# Patient Record
Sex: Female | Born: 1980 | Race: Black or African American | Hispanic: No | Marital: Married | Smoking: Never smoker
Health system: Southern US, Community
[De-identification: ages and names within clinical notes are randomized; demographics above are authoritative.]

## PROBLEM LIST (undated history)

## (undated) ENCOUNTER — Inpatient Hospital Stay (HOSPITAL_COMMUNITY): Payer: Self-pay

## (undated) DIAGNOSIS — I10 Essential (primary) hypertension: Secondary | ICD-10-CM

## (undated) DIAGNOSIS — E119 Type 2 diabetes mellitus without complications: Secondary | ICD-10-CM

## (undated) DIAGNOSIS — F329 Major depressive disorder, single episode, unspecified: Secondary | ICD-10-CM

## (undated) DIAGNOSIS — F32A Depression, unspecified: Secondary | ICD-10-CM

## (undated) HISTORY — PX: OTHER SURGICAL HISTORY: SHX169

## (undated) HISTORY — PX: HERNIA REPAIR: SHX51

## (undated) HISTORY — PX: BREAST SURGERY: SHX581

---
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
## 2003-12-14 ENCOUNTER — Emergency Department (HOSPITAL_COMMUNITY): Admission: EM | Admit: 2003-12-14 | Discharge: 2003-12-15 | Payer: Self-pay | Admitting: *Deleted

## 2004-03-17 ENCOUNTER — Ambulatory Visit (HOSPITAL_COMMUNITY): Admission: RE | Admit: 2004-03-17 | Discharge: 2004-03-17 | Payer: Self-pay | Admitting: Family Medicine

## 2004-03-24 ENCOUNTER — Encounter (HOSPITAL_COMMUNITY): Admission: RE | Admit: 2004-03-24 | Discharge: 2004-04-23 | Payer: Self-pay | Admitting: Family Medicine

## 2004-03-28 ENCOUNTER — Emergency Department (HOSPITAL_COMMUNITY): Admission: EM | Admit: 2004-03-28 | Discharge: 2004-03-28 | Payer: Self-pay | Admitting: Emergency Medicine

## 2004-11-12 ENCOUNTER — Other Ambulatory Visit: Admission: RE | Admit: 2004-11-12 | Discharge: 2004-11-12 | Payer: Self-pay | Admitting: Family Medicine

## 2005-11-23 ENCOUNTER — Other Ambulatory Visit: Admission: RE | Admit: 2005-11-23 | Discharge: 2005-11-23 | Payer: Self-pay | Admitting: *Deleted

## 2006-12-07 ENCOUNTER — Other Ambulatory Visit: Admission: RE | Admit: 2006-12-07 | Discharge: 2006-12-07 | Payer: Self-pay | Admitting: *Deleted

## 2008-04-17 ENCOUNTER — Encounter: Admission: RE | Admit: 2008-04-17 | Discharge: 2008-04-17 | Payer: Self-pay | Admitting: Obstetrics and Gynecology

## 2008-05-26 ENCOUNTER — Inpatient Hospital Stay (HOSPITAL_COMMUNITY): Admission: AD | Admit: 2008-05-26 | Discharge: 2008-05-26 | Payer: Self-pay | Admitting: Obstetrics and Gynecology

## 2008-06-18 ENCOUNTER — Inpatient Hospital Stay (HOSPITAL_COMMUNITY): Admission: RE | Admit: 2008-06-18 | Discharge: 2008-06-21 | Payer: Self-pay | Admitting: Obstetrics and Gynecology

## 2008-06-24 ENCOUNTER — Inpatient Hospital Stay (HOSPITAL_COMMUNITY): Admission: AD | Admit: 2008-06-24 | Discharge: 2008-06-24 | Payer: Self-pay | Admitting: Obstetrics and Gynecology

## 2008-11-22 ENCOUNTER — Ambulatory Visit (HOSPITAL_COMMUNITY): Admission: RE | Admit: 2008-11-22 | Discharge: 2008-11-22 | Payer: Self-pay | Admitting: Surgery

## 2008-12-05 ENCOUNTER — Ambulatory Visit (HOSPITAL_COMMUNITY): Admission: RE | Admit: 2008-12-05 | Discharge: 2008-12-07 | Payer: Self-pay | Admitting: Surgery

## 2008-12-09 ENCOUNTER — Inpatient Hospital Stay (HOSPITAL_COMMUNITY): Admission: EM | Admit: 2008-12-09 | Discharge: 2008-12-16 | Payer: Self-pay | Admitting: Emergency Medicine

## 2010-08-09 IMAGING — US US RENAL
1 series · 14 of 25 positions shown · non-contrast
Comparison: 12/15/2003

CLINICAL DATA: 27-year-old female 35-week pregnant,  back pain,

RENAL/URINARY TRACT ULTRASOUND
TECHNIQUE: Complete ultrasound examination of the urinary tract
was performed including evaluation of the kidneys, renal collecting
systems, and urinary bladder.

[Series 1: us renal · 14 of 35 slices shown]
[im 1/35]
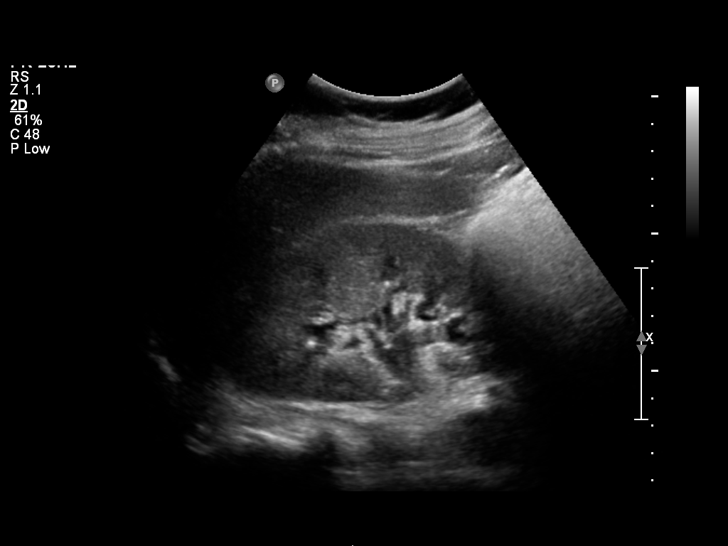
[im 3/35]
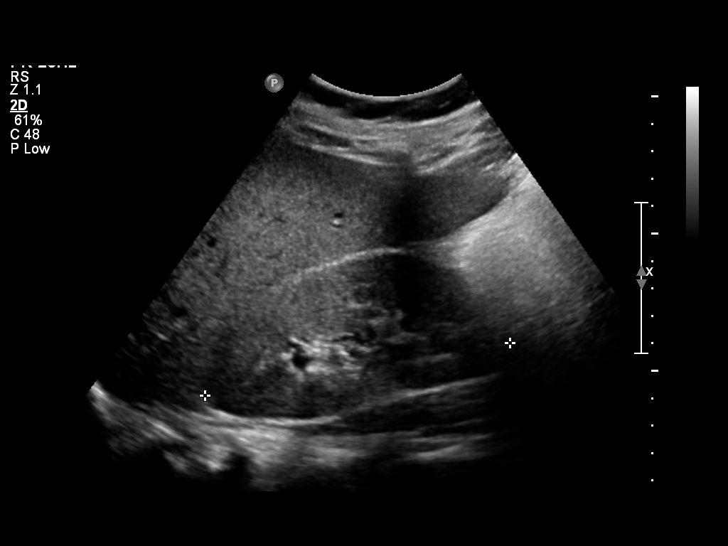
[im 6/35]
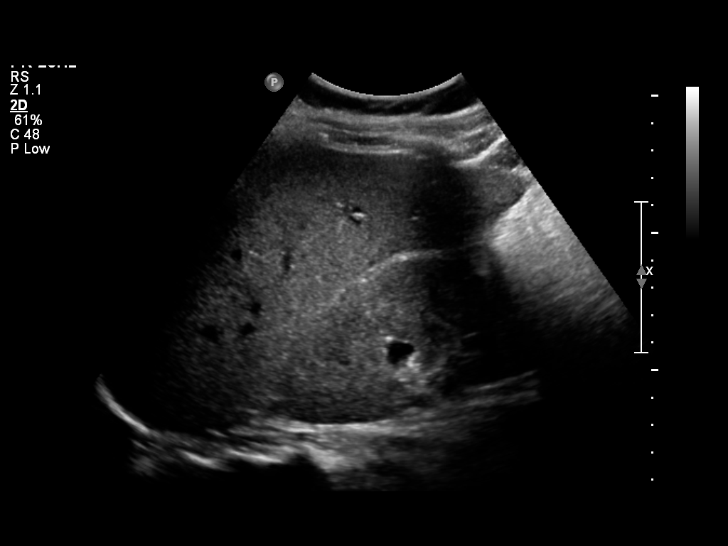
[im 9/35]
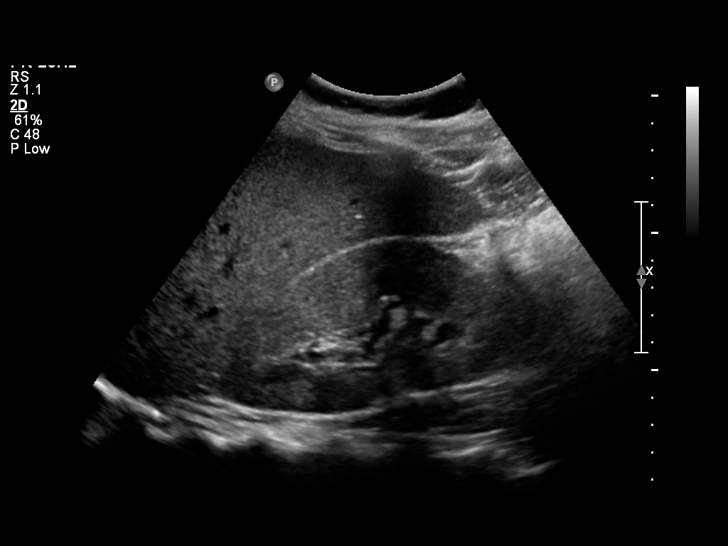
[im 12/35]
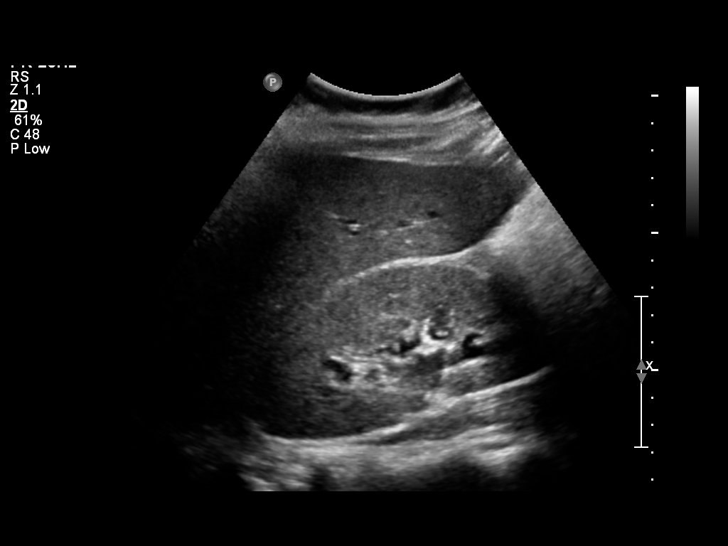
[im 13/35]
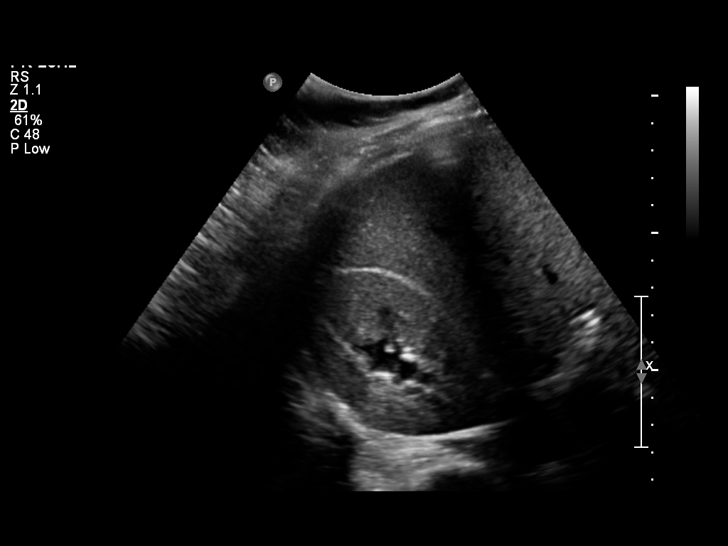
[im 16/35]
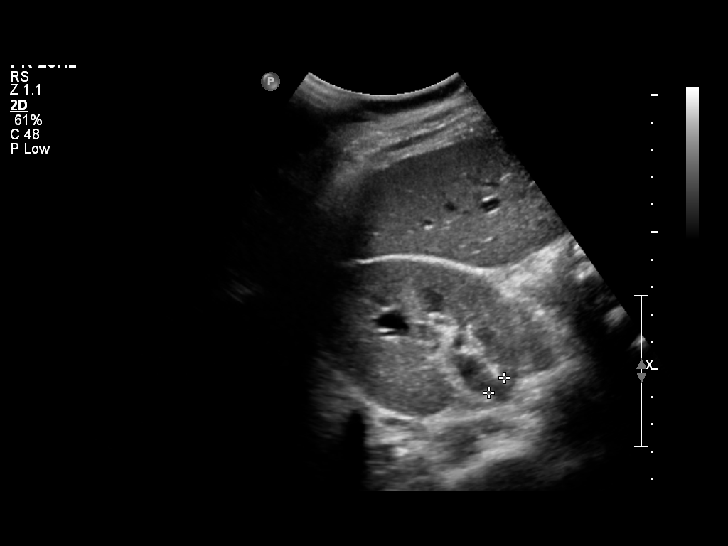
[im 19/35]
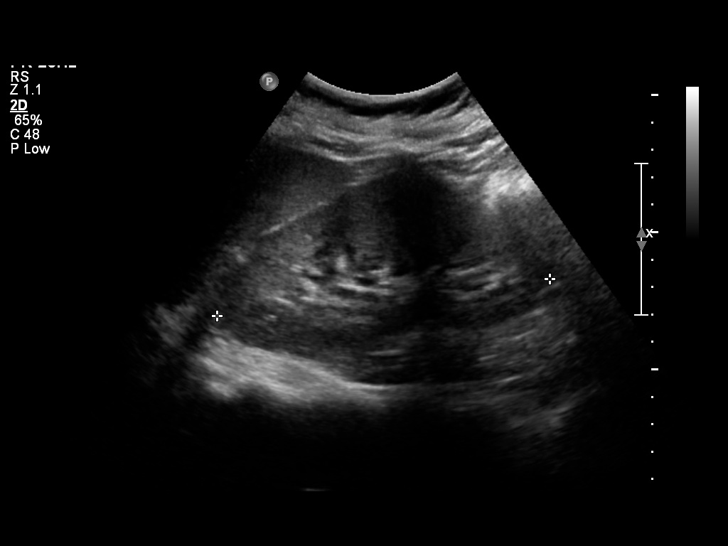
[im 22/35]
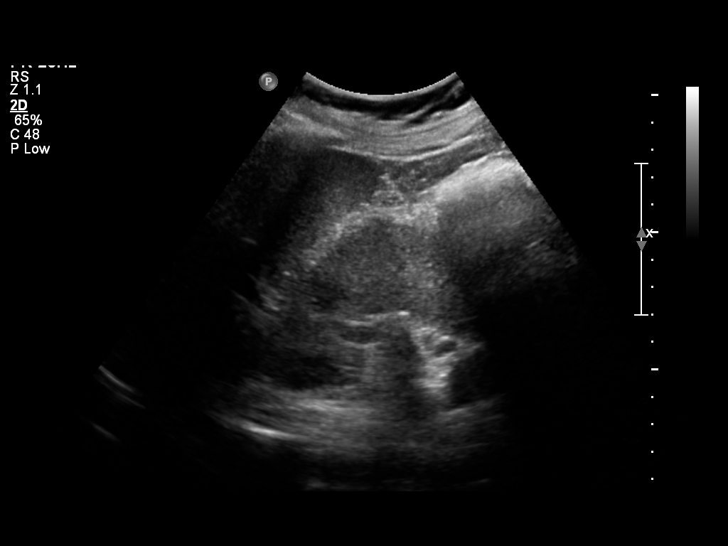
[im 23/35]
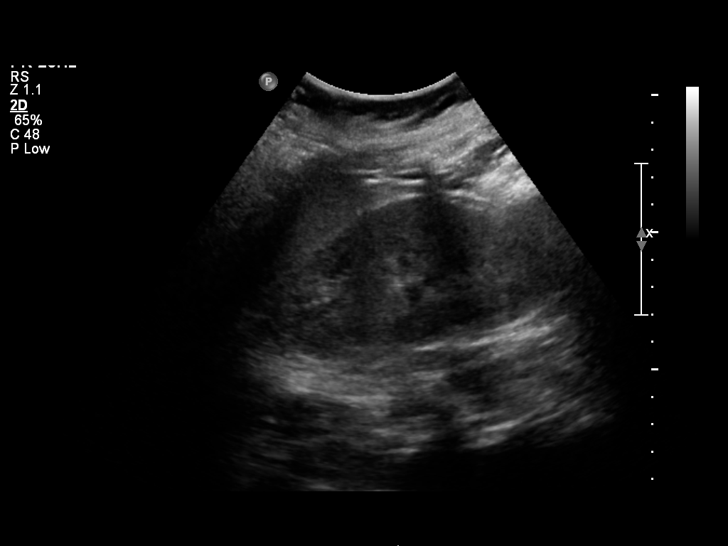
[im 26/35]
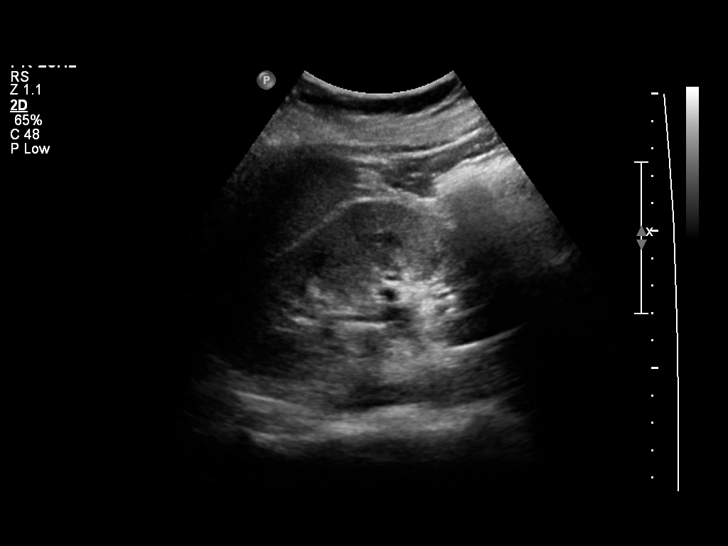
[im 29/35]
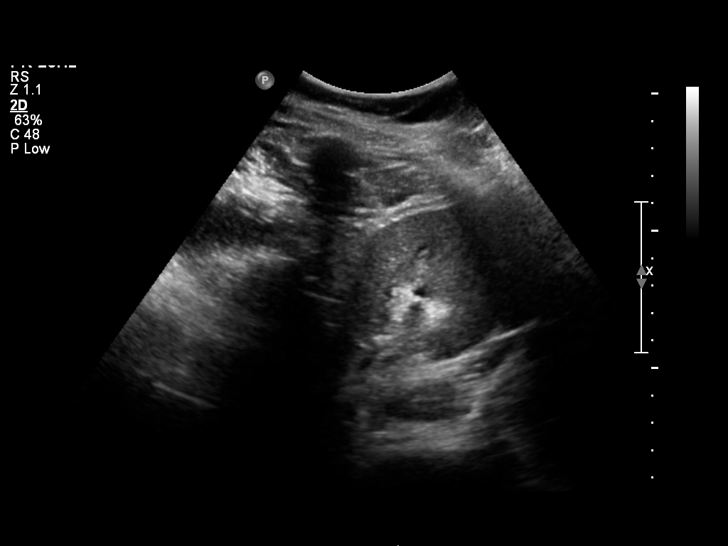
[im 32/35]
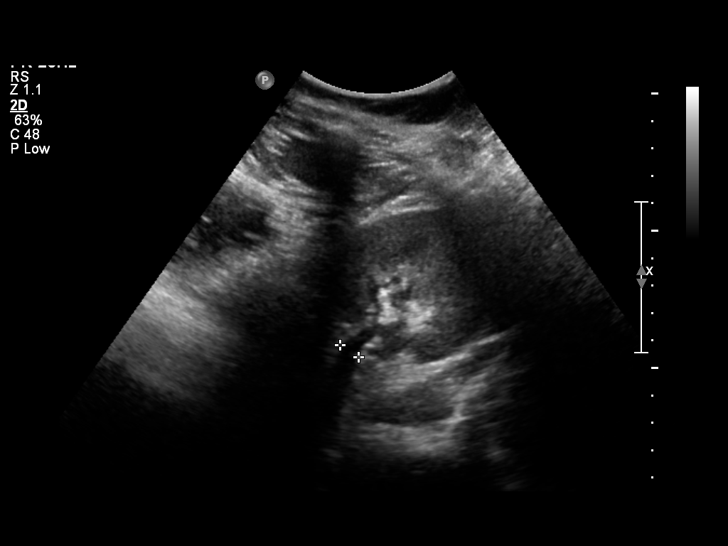
[im 35/35]
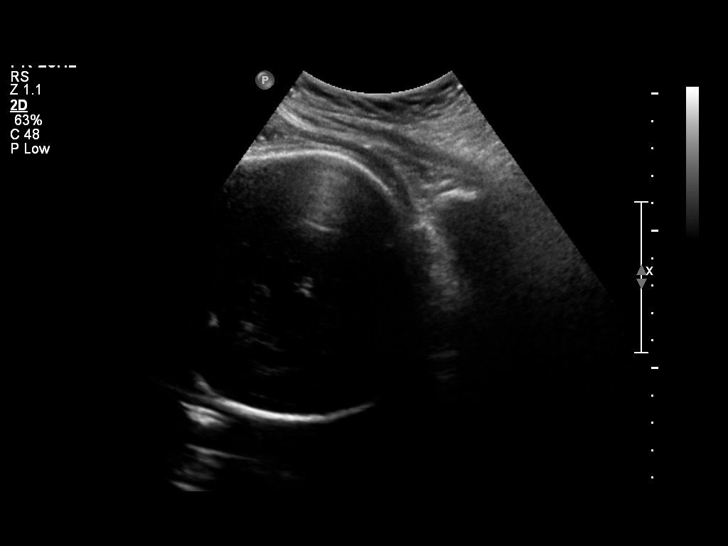

[14 of 25 positions shown; findings below may reference images not displayed]

FINDINGS: Right kidney measures 11.5 cm and demonstrates mild
distention of the renal pelvis and collecting system consistent
with pelviectasis versus mild hydronephrosis.  This could be
physiologic hydronephrosis related to third trimester pregnancy.
Left kidney measures 12.2 cm and demonstrates no evidence of
obstruction or dilatation.  No perinephric fluid collection.  No
definite echogenic renal calculus identified.  The bladder is
decompressed.  No free fluid.
IMPRESSION: Mild right pelviectasis versus hydronephrosis.

## 2010-09-06 LAB — CBC
HCT: 39.1 % (ref 36.0–46.0)
HCT: 39.4 % (ref 36.0–46.0)
HCT: 39.8 % (ref 36.0–46.0)
HCT: 40.5 % (ref 36.0–46.0)
HCT: 33.2 % — ABNORMAL LOW (ref 36.0–46.0)
HCT: 42.8 % (ref 36.0–46.0)
Hemoglobin: 13.1 g/dL (ref 12.0–15.0)
Hemoglobin: 13.3 g/dL (ref 12.0–15.0)
Hemoglobin: 13.6 g/dL (ref 12.0–15.0)
Hemoglobin: 13.7 g/dL (ref 12.0–15.0)
Hemoglobin: 12.3 g/dL (ref 12.0–15.0)
Hemoglobin: 14.3 g/dL (ref 12.0–15.0)
MCHC: 33.7 g/dL (ref 30.0–36.0)
MCHC: 33.8 g/dL (ref 30.0–36.0)
MCHC: 33.8 g/dL (ref 30.0–36.0)
MCHC: 34.1 g/dL (ref 30.0–36.0)
MCHC: 33 g/dL (ref 30.0–36.0)
MCHC: 33.5 g/dL (ref 30.0–36.0)
MCV: 89.5 fL (ref 78.0–100.0)
MCV: 89.6 fL (ref 78.0–100.0)
MCV: 89.7 fL (ref 78.0–100.0)
MCV: 90.7 fL (ref 78.0–100.0)
MCV: 90.6 fL (ref 78.0–100.0)
MCV: 90.9 fL (ref 78.0–100.0)
Platelets: 330 10*3/uL (ref 150–400)
Platelets: 340 10*3/uL (ref 150–400)
Platelets: 368 10*3/uL (ref 150–400)
Platelets: 372 10*3/uL (ref 150–400)
Platelets: 292 10*3/uL (ref 150–400)
Platelets: 355 10*3/uL (ref 150–400)
RBC: 4.31 MIL/uL (ref 3.87–5.11)
RBC: 4.4 MIL/uL (ref 3.87–5.11)
RBC: 4.44 MIL/uL (ref 3.87–5.11)
RBC: 4.52 MIL/uL (ref 3.87–5.11)
RBC: 4.07 MIL/uL (ref 3.87–5.11)
RBC: 4.72 MIL/uL (ref 3.87–5.11)
RDW: 13.5 % (ref 11.5–15.5)
RDW: 13.5 % (ref 11.5–15.5)
RDW: 13.5 % (ref 11.5–15.5)
RDW: 13.7 % (ref 11.5–15.5)
RDW: 13.2 % (ref 11.5–15.5)
RDW: 13.5 % (ref 11.5–15.5)
WBC: 20.1 10*3/uL — ABNORMAL HIGH (ref 4.0–10.5)
WBC: 21.4 10*3/uL — ABNORMAL HIGH (ref 4.0–10.5)
WBC: 21.8 10*3/uL — ABNORMAL HIGH (ref 4.0–10.5)
WBC: 22.2 10*3/uL — ABNORMAL HIGH (ref 4.0–10.5)
WBC: 13.6 10*3/uL — ABNORMAL HIGH (ref 4.0–10.5)
WBC: 9.6 10*3/uL (ref 4.0–10.5)

## 2010-09-06 LAB — DIFFERENTIAL
Basophils Absolute: 0.1 10*3/uL (ref 0.0–0.1)
Basophils Absolute: 0.1 10*3/uL (ref 0.0–0.1)
Basophils Absolute: 0 10*3/uL (ref 0.0–0.1)
Basophils Relative: 0 % (ref 0–1)
Basophils Relative: 0 % (ref 0–1)
Basophils Relative: 0 % (ref 0–1)
Eosinophils Absolute: 0.3 10*3/uL (ref 0.0–0.7)
Eosinophils Absolute: 0.3 10*3/uL (ref 0.0–0.7)
Eosinophils Absolute: 0 10*3/uL (ref 0.0–0.7)
Eosinophils Relative: 1 % (ref 0–5)
Eosinophils Relative: 1 % (ref 0–5)
Eosinophils Relative: 0 % (ref 0–5)
Lymphocytes Relative: 16 % (ref 12–46)
Lymphocytes Relative: 17 % (ref 12–46)
Lymphocytes Relative: 5 % — ABNORMAL LOW (ref 12–46)
Lymphs Abs: 3.4 10*3/uL (ref 0.7–4.0)
Lymphs Abs: 3.6 10*3/uL (ref 0.7–4.0)
Lymphs Abs: 0.6 10*3/uL — ABNORMAL LOW (ref 0.7–4.0)
Monocytes Absolute: 2.4 10*3/uL — ABNORMAL HIGH (ref 0.1–1.0)
Monocytes Absolute: 2.6 10*3/uL — ABNORMAL HIGH (ref 0.1–1.0)
Monocytes Absolute: 1.6 10*3/uL — ABNORMAL HIGH (ref 0.1–1.0)
Monocytes Relative: 11 % (ref 3–12)
Monocytes Relative: 12 % (ref 3–12)
Monocytes Relative: 11 % (ref 3–12)
Neutro Abs: 15.2 10*3/uL — ABNORMAL HIGH (ref 1.7–7.7)
Neutro Abs: 15.2 10*3/uL — ABNORMAL HIGH (ref 1.7–7.7)
Neutro Abs: 11.4 10*3/uL — ABNORMAL HIGH (ref 1.7–7.7)
Neutrophils Relative %: 70 % (ref 43–77)
Neutrophils Relative %: 71 % (ref 43–77)
Neutrophils Relative %: 84 % — ABNORMAL HIGH (ref 43–77)

## 2010-09-06 LAB — COMPREHENSIVE METABOLIC PANEL
ALT: 26 U/L (ref 0–35)
AST: 46 U/L — ABNORMAL HIGH (ref 0–37)
Albumin: 2.7 g/dL — ABNORMAL LOW (ref 3.5–5.2)
Alkaline Phosphatase: 82 U/L (ref 39–117)
BUN: 13 mg/dL (ref 6–23)
CO2: 25 mEq/L (ref 19–32)
Calcium: 8.8 mg/dL (ref 8.4–10.5)
Chloride: 105 mEq/L (ref 96–112)
Creatinine, Ser: 0.77 mg/dL (ref 0.4–1.2)
GFR calc Af Amer: >60 mL/min (ref >60)
GFR calc non Af Amer: >60 mL/min (ref >60)
Glucose, Bld: 141 mg/dL — ABNORMAL HIGH (ref 70–99)
Potassium: 5.7 mEq/L — ABNORMAL HIGH (ref 3.5–5.1)
Sodium: 139 mEq/L (ref 135–145)
Total Bilirubin: 2.5 mg/dL — ABNORMAL HIGH (ref 0.3–1.2)
Total Protein: 6.7 g/dL (ref 6.0–8.3)

## 2010-09-06 LAB — BASIC METABOLIC PANEL
BUN: 5 mg/dL — ABNORMAL LOW (ref 6–23)
BUN: 8 mg/dL (ref 6–23)
CO2: 28 mEq/L (ref 19–32)
Calcium: 8.9 mg/dL (ref 8.4–10.5)
Chloride: 104 mEq/L (ref 96–112)
Chloride: 106 mEq/L (ref 96–112)
Creatinine, Ser: 0.9 mg/dL (ref 0.4–1.2)
Creatinine, Ser: 0.82 mg/dL (ref 0.4–1.2)
GFR calc Af Amer: >60 mL/min (ref >60)
GFR calc non Af Amer: >60 mL/min (ref >60)
GFR calc non Af Amer: >60 mL/min (ref >60)
Glucose, Bld: 124 mg/dL — ABNORMAL HIGH (ref 70–99)
Glucose, Bld: 110 mg/dL — ABNORMAL HIGH (ref 70–99)
Potassium: 4.5 mEq/L (ref 3.5–5.1)
Potassium: 3.6 mEq/L (ref 3.5–5.1)
Sodium: 137 mEq/L (ref 135–145)

## 2010-09-06 LAB — HEPATIC FUNCTION PANEL
ALT: 24 U/L (ref 0–35)
AST: 22 U/L (ref 0–37)
Albumin: 2.6 g/dL — ABNORMAL LOW (ref 3.5–5.2)
Alkaline Phosphatase: 93 U/L (ref 39–117)
Bilirubin, Direct: 0.2 mg/dL (ref 0.0–0.3)
Indirect Bilirubin: 0.5 mg/dL (ref 0.3–0.9)
Total Bilirubin: 0.7 mg/dL (ref 0.3–1.2)
Total Protein: 5.8 g/dL — ABNORMAL LOW (ref 6.0–8.3)

## 2010-09-06 LAB — URINALYSIS, ROUTINE W REFLEX MICROSCOPIC
Leukocytes, UA: NEGATIVE
Nitrite: NEGATIVE
Specific Gravity, Urine: 1.03 (ref 1.005–1.030)
Urobilinogen, UA: 0.2 mg/dL (ref 0.0–1.0)
pH: 6.5 (ref 5.0–8.0)

## 2010-09-06 LAB — CREATININE, SERUM
Creatinine, Ser: 0.68 mg/dL (ref 0.4–1.2)
GFR calc Af Amer: >60 mL/min (ref >60)
GFR calc non Af Amer: >60 mL/min (ref >60)

## 2010-09-06 LAB — URINE MICROSCOPIC-ADD ON

## 2010-09-06 LAB — ANAEROBIC CULTURE: Gram Stain: NONE SEEN

## 2010-09-06 LAB — LIPASE, BLOOD
Lipase: 14 U/L (ref 11–59)
Lipase: 30 U/L (ref 11–59)

## 2010-09-06 LAB — PREGNANCY, URINE: Preg Test, Ur: NEGATIVE

## 2010-09-06 LAB — POTASSIUM: Potassium: 4.4 mEq/L (ref 3.5–5.1)

## 2010-09-06 LAB — TISSUE CULTURE: Culture: NO GROWTH

## 2010-09-06 LAB — MAGNESIUM: Magnesium: 2.1 mg/dL (ref 1.5–2.5)

## 2010-09-06 LAB — GRAM STAIN: Gram Stain: NONE SEEN

## 2010-09-06 LAB — BILIRUBIN, TOTAL: Total Bilirubin: 0.7 mg/dL (ref 0.3–1.2)

## 2010-09-14 LAB — CBC
HCT: 32.6 % — ABNORMAL LOW (ref 36.0–46.0)
Hemoglobin: 11.2 g/dL — ABNORMAL LOW (ref 12.0–15.0)
Hemoglobin: 13.4 g/dL (ref 12.0–15.0)
MCHC: 33.7 g/dL (ref 30.0–36.0)
MCHC: 33.7 g/dL (ref 30.0–36.0)
MCHC: 34.4 g/dL (ref 30.0–36.0)
MCV: 95.6 fL (ref 78.0–100.0)
MCV: 96.4 fL (ref 78.0–100.0)
MCV: 97.1 fL (ref 78.0–100.0)
RBC: 3.42 MIL/uL — ABNORMAL LOW (ref 3.87–5.11)
RBC: 4.11 MIL/uL (ref 3.87–5.11)
RDW: 13.9 % (ref 11.5–15.5)
RDW: 14.2 % (ref 11.5–15.5)
RDW: 14.6 % (ref 11.5–15.5)

## 2010-09-14 LAB — BASIC METABOLIC PANEL
BUN: 5 mg/dL — ABNORMAL LOW (ref 6–23)
CO2: 24 mEq/L (ref 19–32)
Calcium: 9.3 mg/dL (ref 8.4–10.5)
Chloride: 106 mEq/L (ref 96–112)
Creatinine, Ser: 0.56 mg/dL (ref 0.4–1.2)
Glucose, Bld: 126 mg/dL — ABNORMAL HIGH (ref 70–99)

## 2010-09-14 LAB — URIC ACID: Uric Acid, Serum: 7.2 mg/dL — ABNORMAL HIGH (ref 2.4–7.0)

## 2010-09-14 LAB — COMPREHENSIVE METABOLIC PANEL
AST: 25 U/L (ref 0–37)
BUN: 7 mg/dL (ref 6–23)
CO2: 25 mEq/L (ref 19–32)
Chloride: 105 mEq/L (ref 96–112)
Creatinine, Ser: 0.72 mg/dL (ref 0.4–1.2)
GFR calc non Af Amer: >60 mL/min (ref >60)
Glucose, Bld: 85 mg/dL (ref 70–99)
Total Bilirubin: 0.2 mg/dL — ABNORMAL LOW (ref 0.3–1.2)

## 2010-10-13 NOTE — Op Note (Signed)
[name], [name]            ACCOUNT NO.:  [id number]   MEDICAL RECORD NO.:  [id number]          PATIENT TYPE:  INP   LOCATION:  9106                          FACILITY:  [facility]   PHYSICIAN:  Maxie Better, M.D.DATE OF BIRTH:  [birth year]   DATE OF PROCEDURE:  06/18/2008  DATE OF DISCHARGE:                               OPERATIVE REPORT   PREOPERATIVE DIAGNOSES:  1. Fetal macrosomia.  2. Class A2 gestational diabetes.  3. Intrauterine gestation at 39 weeks.   PROCEDURE:  Primary cesarean section Kerr hysterotomy.   POSTOPERATIVE DIAGNOSES:  1. Fetal macrosomia.  2. Class A2 gestational diabetes.  3. Intrauterine gestation at 39 weeks.   ANESTHESIA:  Spinal.   SURGEON:  Maxie Better, MD   ASSISTANT:  Marlinda Mike, CNM   INDICATIONS:  A 30 year old gravida 1, para 0 female at 48 weeks with an  estimated fetal weight of 9 pounds 13 ounces with known class A2  gestational diabetes now presents for surgical management.  Consent  signed.  The patient was transferred to the operating room.   PROCEDURE:  Under adequate spinal anesthesia, the patient was placed in  a supine position with a left lateral tilt.  She was sterilely prepped  and draped in usual fashion.  An indwelling Foley catheter was sterilely  placed.  Marcaine 0.25% was injected along the planned Pfannenstiel skin  incision site.  Pfannenstiel skin incision was then made, carried down  to the rectus fascia.  Rectus fascia was opened transversely.  Rectus  fascia was then bluntly and sharply dissected off the rectus muscle in  superior and inferior fashion.  Rectus muscles split in midline.  The  parietal peritoneum was entered sharply and extended superiorly and  inferiorly.  The baby was dextra rotated.  No uterine segment had some  prominent vessels.  The vesicouterine peritoneum was opened  transversely.  The bladder bluntly dissected off the uterine segment.  Curvilinear low transverse uterine  incision was then made and extended  with bandage scissors.  Artificial rupture of membranes was performed.  Clear copious amniotic fluid noted.  Subsequent delivery of a live female  from the left occiput transverse position was accomplished.  Baby was  bulb suctioned in the abdomen.  Cord around the neck x1 was reducible  and cord around the foot was also noted.  Cord was clamped and cut, the  baby was transferred to the awaiting pediatrician who assigned Apgars of  8 and 9 at one and five minutes.  The placenta, which was large was  manually removed.  The uterine cavity was cleaned of debris.  Uterine  incision had multiple pumping vessels, it was closed with 0-Monocryl  running locked stitch first layer, second layer was imbricated using 0-  Monocryl sutures.  On the right, there was bleeding noted, which  required multiple figure-of-eight Monocryl suture.  Ultimately, the  uterus had to be exteriorized.  Normal tubes and ovaries were noted at  that point.  The site of bleeding on the right was then inspected.  Additional sutures placed.  The uterus was then turned back to the  abdomen without  incident.  The peritoneal edges were cauterized.  Bleeding again noted in the same site, figure-of-eight sutures placed,  and when good hemostasis was felt to be noted, the abdomen was copiously  irrigated and suctioned of debris.  The decision was then made to close  the parietal peritoneum.  At that point, the loop of bowel and omentum  was in the field.  They were packed upwardly.  It was then noted as  closure started that there was again bleeding noted bright red,  therefore the area was re-inspected again, bleeding was noted bright red  in that right side, ultimately a pumping vessel was noted lower way down  below for which figure-of-eight suture was placed of a 2-0 Vicryl.  The  right fallopian tube was also noted to be bleeding at the fimbriae and  this was gently and carefully cauterized.   A figure-of-eight was again  placed where the area of bleeding was and ultimately hemostasis again  noted.  Abdomen was then irrigated and suctioned.  Once again hemostasis  was felt to be achieved, the packings removed.  Abdomen again irrigated  and suctioned.  The parietal peritoneum was able to be then closed.  Prior to closure, 3% Nesacaine was infiltrated in the abdomen.  The  parietoperitoneum having been closed, the rectus fascia was then closed  with 0-Vicryl x2.  Again bleeding on the surfaces of the subcutaneous  area was noted, cauterized, interrupted 2-0 plain sutures placed, then  again bleeding again noted.  Small bleeders cauterized and ultimately  the skin was approximated using Ethicon staples.   SPECIMENS:  Placenta not sent to Pathology.   ESTIMATED BLOOD LOSS:  1200 mL.   INTRAOPERATIVE FLUID:  3 L.   URINE OUTPUT:  400 mL of clearer urine.   Sponge and instrument counts x2 was correct.   COMPLICATIONS:  None.   Weight of baby was 9 pounds 12 ounces.  The patient tolerated the  procedure well and was transferred to recovery room in stable condition.  On further questioning, husband reports that she does do fair amount of  bleeding at dental appointments and therefore von Willebrand panel will  be ordered as part of her postop labs.      Maxie Better, M.D.  Electronically Signed     [place]/MEDQ  D:  06/18/2008  T:  06/19/2008  Job:  [id number]

## 2010-10-13 NOTE — Op Note (Signed)
NAMEERCELLE, WINKLES            ACCOUNT NO.:  [id number]   MEDICAL RECORD NO.:  [id number]          PATIENT TYPE:  OIB   LOCATION:  1534                         FACILITY:  [facility]   PHYSICIAN:  Ardeth Sportsman, MD     DATE OF BIRTH:  1981/01/21   DATE OF PROCEDURE:  12/05/2008  DATE OF DISCHARGE:                               OPERATIVE REPORT   PRIMARY CARE PHYSICIAN:  Sheronette M. Cousins, MD.   PREOPERATIVE DIAGNOSIS:  Periumbilical ventral hernia.   POSTOPERATIVE DIAGNOSES:  1. Periumbilical ventral hernia.  2. Diastasis recti.   PROCEDURE PERFORMED:  Laparoscopic ventral hernia repair with mesh.   SURGEON:  Ardeth Sportsman, MD.   ANESTHESIA:  1. She underwent general anesthesia.  2. Local in consecutive field block on the port sites.   SPECIMENS:  None.   DRAINS:  None.   ESTIMATED BLOOD LOSS:  Less than 5 mL.   COMPLICATIONS:  None apparent.   INDICATIONS:  Ms. Reynoso is a 30 year old female, who had a C-section  back in January 2010.  She has always had a periumbilical hernia.  During her pregnancy, it became much more prominent.  She also was noted  to have a supraumbilical hernia as well and they have gotten larger and  have become more symptomatic.   The anatomy and embryology of abdominal formation was discussed.  The  pathophysiology of abdominal herniation with its risks of incarceration,  strangulation, and other risks were discussed.  Options were discussed.  Recommendation was made for a diagnostic laparoscopy with possible lysis  of adhesions and ventral hernia repair.  Risks, benefits, and  alternatives were discussed, questions answered, and she agreed to  proceed.   OPERATIVE FINDINGS:  She had moderate diastasis recti from her xiphoid  down to her pubis.  She had periumbilical Swiss cheese hernia defects in  a patch-like pattern, about 9 x 15 cm region.  There was no  incarceration.   DESCRIPTION OF PROCEDURE:  Informed consent was confirmed.   The patient  received IV clindamycin and gentamicin given her CEPHALOSPORIN  ALLERGIES.  She underwent general anesthesia without any difficulty.  She had sequential compression devices active during the entire case.  She had a Foley catheter sterilely placed.  She was then positioned  supine with both arms tucked.  Her abdomen was prepped and draped in a  sterile fashion.  A surgical timeout confirmed our plan.   A #5-mm port was placed on the right upper quadrant using optical entry  technique with the patient in steep reverse Trendelenburg and left side  up.  Camera inspection revealed no intraabdominal injury.  Under direct  visualization, a 5-mm port was placed in the left flank and 1 later in  the right flank.  A 10-mm port was placed in the left suprapubic region  through her prior Pfannenstiel incision, staying away from the bladder.   Camera inspection revealed the ventral hernia defect as noted above.  There were no adhesions to the anterior abdominal wall.  The falciform  ligament was freed off.  The defect was measured.   She had a  significant diastasis recti in the setting of periumbilical  hernia as well in a pretty impressive defect and I went ahead and chose  a 20 x 25-cm mesh for good overlap.  I placed #1 Novofil stitches x12  around the edges of the mesh about every 5 cm on the circumference.  It  was wrapped and rolled rough-side-in and placed in the abdomen and  unrolled.  It was secured to the anterior abdominal wall using a  laparoscopic suture passer.  It was tacked with reduced insufflation  such that there was excellent coverage basically going from the liver  down to the suprapubic region for excellent overlap.  It also went over  to the right and left pericolic gutters.  A tacker was used to help tack  the edges of the mesh to keep the mesh from rolling up, although it lay  quite well.  A few tacks were placed more centrally.   Camera inspection was done  with no evidence of any injury.  The 10 mm  port site in the left suprapubic region was closed using a #1 Novofil  stitch using a laparoscopic suture passer.  This was taken to also hold  the mesh as the edge of the mesh came just a little bit inferior to that  port site.  Capnoperitoneum was evacuated and the ports were removed.  The skin was closed using a 4-0 Monocryl stitch.  A sterile dressing was  applied.  The patient was extubated and sent to the recovery room in  stable condition.   I discussed postop care with the patient in detail with her husband in  my office and then with both of them just prior to surgery and I am  about to discuss it with her husband afterwards.  She wished to go home  tonight.  Given that I had to put a larger mesh than initially expected,  she may need at least an overnight stay, but we will see.      Ardeth Sportsman, MD  Electronically Signed     SCG/MEDQ  D:  12/05/2008  T:  12/05/2008  Job:  [id number]   cc:   Maxie Better, M.D.  Fax: [phone]

## 2010-10-13 NOTE — Discharge Summary (Signed)
NAMEJAHDAI, [name]            ACCOUNT NO.:  [id number]   MEDICAL RECORD NO.:  [id number]          PATIENT TYPE:  INP   LOCATION:  1531                         FACILITY:  [facility]   PHYSICIAN:  Ardeth Sportsman, MD     DATE OF BIRTH:  1980/10/04   DATE OF ADMISSION:  12/09/2008  DATE OF DISCHARGE:  12/16/2008                               DISCHARGE SUMMARY   PRIMARY CARE PHYSICIAN:  Maxie Better, M.D.   SURGEON:  Ardeth Sportsman, MD   PRINCIPAL FINAL DISCHARGE DIAGNOSIS:  Small bowel obstruction.   OTHER DIAGNOSES:  1. Repair of umbilical ventral hernia status post repair on December 05, 2008.  2. Gestational diabetes, class A2 status post C-section January 2010.  3. Status post wisdom teeth extraction.   PRINCIPAL PROCEDURE:  Diagnostic laparoscopy with lysis of adhesions on  December 09, 2008.   HOSPITAL COURSE:  [name] is a 30 year old female who had diastasis  recti and moderate periumbilical hernia requiring a laparoscopic ventral  hernia repair with moderate sized mesh on July 8.  She had some  postoperative pain, required staying for a couple of days, but she went  home on postop day #2. She returned on July 12 with worsening abdominal  pain and distention.  She was admitted and hydrated and placed on NG  tube.  She had a CT scan done that showed evidence of high-grade small  bowel obstruction.  Follow up exam was concerning for perhaps some  peritoneal signs.  Therefore, I took her to the operating room to do  diagnostic laparoscopy.  I was able to laparoscopically lyse some  adhesions.  She had some numerous soft anterior loop adhesions from  general inflammation with an obvious transition point in the right lower  quadrant near her distal ileum.  She had some very dilated loops, but no  evidence of a necrosis.   Postoperatively, she had a prolonged ileus that required IV fluids and  NG tube decompression.  On postoperative day #4, she started having a  little  bit of flatus.  I did an NG tube clamping.  Her NG tube was  removed on postop day #5.  She was started on clear liquids.  On  postoperative day #7, she was walking the hallways.  She had tolerated a  soft diet well.  She had flatus and bowel movements.  She had minimal  tenderness and required very little oral pain medications.  Of note, she  did have a leukocytosis in the 20s, but clinically looked much improved.  She denied any dysuria, pyuria or any loose stools or diarrhea.  Her  incisions looked well healed with minimal tenderness only.   The patient improvements allow her to be reasonably discharged home with  the following instructions.  1. She is to return to the clinic to see me in about a week with close      follow up.  2. She should call if she has any fevers, chills, sweats, worsening      nausea, vomiting, pain, drainage from her incisions, dysuria,  uncontrolled constipation, diarrhea or other issues.  3. She should continue to advance her diet gradually with some fiber,      plenty of fluids and ambulation and watch out for severe      constipation and diarrhea.  4. She should continue on her oral oxycodone 15 mg p.o. q.4 h p.r.n.      pain.  5. Alleve 1-2 p.o. q.12 h p.r.n. pain.  6. Ice pack and/or heating pads and warm showers and warm baths p.r.n.      pain.  7. She can resume her home medications including multivitamin daily      and Ortho Tri-Cyclen daily.  8. She expressed appreciation and looked great.  I thought it was safe      to discharge her home.      Ardeth Sportsman, MD  Electronically Signed     SCG/MEDQ  D:  12/16/2008  T:  12/16/2008  Job:  [id number]   cc:   Maxie Better, M.D.  Fax: [phone]

## 2010-10-13 NOTE — Op Note (Signed)
NAMELUZELENA, [name]            ACCOUNT NO.:  [id number]   MEDICAL RECORD NO.:  [id number]          PATIENT TYPE:  INP   LOCATION:  1314                         [facility]ACILITY:  [facility]   PHYSICIAN:  Ardeth Sportsman, MD     DATE OF BIRTH:  1981-01-01   DATE OF PROCEDURE:  DATE OF DISCHARGE:                               OPERATIVE REPORT   PREOPERATIVE DIAGNOSES:  1. Postoperative day 3 status post laparoscopic ventral hernia repair      with mesh.  2. Small bowel obstruction presumed secondary to new adhesion.   POSTOPERATIVE DIAGNOSES:  1. Postoperative day 3 status post laparoscopic ventral hernia repair      with mesh.  2. Small bowel obstruction secondary to interloop small bowel      adhesions.   ANESTHESIA:  1. General.  2. Local anesthetic and field block around all port sites.   SPECIMENS:  Peritoneal stripping sent for Gram stain, no white cells, no  organisms.   DRAINS:  None.   ESTIMATED BLOOD LOSS:  Less than 10 mL.   COMPLICATIONS:  None.   APPARENT INDICATIONS:  Ms. [name] is a 30 year old obese female who had  periumbilical ventral wall hernias and required laparoscopic repair  using a moderately large-sized mesh.  Initially, she was pink, drove and  went home on postoperative day 2.  When she was at home, she had  worsening abdominal pain, nausea and vomiting.  Her husband brought her  back in last night.  She was admitted, hydrated.  Plain films are  concerning for a possible small bowel obstruction.  A CAT scan was done  around 3:30 this morning showing evidence of concern for high-grade  small bowel obstruction.  NG tube was placed.  I saw her a few hours  later, and her symptoms seemed worse and more tender.  There is concern.  Recommendations were made for diagnostic laparoscopy, possible  laparotomy with lysis of adhesions and rule out evidence of any valve  perforation, injury or ischemic necrosis.  Technique was discussed.  The  risks, benefits and  alternatives were discussed.  Questions were  answered.  She and her husband agreed.   OPERATIVE FINDINGS:  She had a markedly dilated stomach and proximal and  mid jejunum.  She had an obvious transition point in her mid ileum and  the right lower quadrant slightly right of midline that was interloop.  She had some patches of phlegmon but no evidence of any true abscess.  There was no evidence of perforation in gallbladder, liver, colon,  ovary, uterus, appendix, otherwise looked completely normal.  The mesh  was in place with no evidence of any adhesions to the mesh.   DESCRIPTION OF PROCEDURE:  Informed consent was confirmed.  The patient  underwent general anesthesia without any difficulty.  She already had an  NG tube in place.  This was changed out to a larger NG tube and got  about 300 mL of thick bile fluid back.  She had a Foley catheter  sterilely placed after undergoing general anesthesia.  She was placed in  supine with both arms  tucked.  Her abdomen was prepped and draped in  sterile fashion.   A #5 mm port was placed in the left upper quadrant with the patient's in  steep reverse Trendelenburg with the left side up just at the costal  ridge using very careful dissection.  We  entered __________  and  actually come up aimed towards above the left hepatic lobe.  We kept the  capnoperitoneum to 15 mmHg right at adequate abdominal distention and  encountered obviously dilated small bowel.  There was a fair amount of  ascites of slight bile tinge.  Her serum bilirubin was 2.5.  Under  direct visualization, a 5 mm port was placed in the left mid abdomen and  left lower quadrant.  The fluid was aspirated, and a couple of  peritoneal strippings were sent off for Gram stain.   There were no adhesions to the fascia on the anterior abdominal wall and  it looked intact.  There was a little bit of a brown staining of the  mesh and peritoneal fluid, classic for the Proceed type mesh.   I did not  see any evidence of any succus, abscess or purulence.  We removed some  of the dilated small bowel to get down to the right lower quadrant.  Eventually, I could get to the cecum and found the appendix.  They  looked normal.  I found a decompressed small bowel and ran it down  distally until I found the terminal ileum.  I ran the small bowel back  from the terminal ileum all the way to the ligament of Treitz.  There  was some inflammatory peeling between loops.  There was an obvious  transition zone in the proximal/mid ileum, and once that was unfolded,  it seemed to helped decompress some of the dilated jejunum and proximal  ileum.  Mesentery was intact and viable.  There was no evidence of any  internal hernias.  I could not see any evidence of a closed loop  obstruction.  There are no adhesions to the anterior abdominal wall.   The ascites is completely aspirated in the bilateral upper quadrants and  gutters in the pelvis.  There was copious irrigation with over 6 liters  of saline and reevaluated with fluid that cleared up markedly.  I ran  the small bowel again in its entirety and could not find any evidence of  any enterotomies, and aside from a little serositis here and there, no  evidence of any bowel injury, ischemia, or necrosis.  Following the  colon, and we were able to follow that all the way from the cecum to the  sigmoid and it looked completely normal.  The great omentum did not look  inflamed.  The gallbladder looked normal and did not  seemed distended.  The duodenal sweep looked normal.  Stomach was markedly dilated but  after switching out, pulling instruments out and cleaning off and  placing back in, I got excellent decompression, and it looked normal as  well.   Tacks were intact on the mesh as well as the fascial stitches without  adhesions.  Could not get any purulence or other abnormalities.  I  looked down at the pelvis, and clear return was noted at  the end.  The  ovaries and the uterus looked completely normal as well.  Bladder looked  normal as well.  Carefully reinspected between loops, and again, I could  find no evidence of any bowel injury and no  succus.   The Gram stain came back showing no white cells and no organisms.  With  that, I felt it would be reasonable to stop since I had found an obvious  transition point and could not prove any evidence of perforation,  ischemia or necrosis.  At this point, I irrigated with about 2 gallons  of Crystalloid with crystal-clear return at the end.  Capnoperitoneum  was evacuated.  The 5 mm ports were removed.  The skin was closed with 4-  0 Monocryl stitch to a watertight field.  Steri-strips were applied.  The patient was extubated and taken to the  recovery room in stable condition.  I discussed the postoperative  findings with the patient's husband.  I will keep an NG tube placed and  watch her rather closely.  I will follow up on her cultures.  I will  keep her on antibiotics for the next 23 hours and then follow her  closely.      Ardeth Sportsman, MD  Electronically Signed     SCG/MEDQ  D:  12/09/2008  T:  12/09/2008  Job:  [id number]   cc:   Maxie Better, M.D.  Fax: [phone]

## 2010-10-13 NOTE — H&P (Signed)
[name], [name]            ACCOUNT NO.:  [id number]   MEDICAL RECORD NO.:  [id number]          PATIENT TYPE:  INP   LOCATION:  1314                         FACILITY:  [facility]   PHYSICIAN:  Ardeth Sportsman, MD     DATE OF BIRTH:  [birth year]   DATE OF ADMISSION:  12/08/2008  DATE OF DISCHARGE:                              HISTORY & PHYSICAL   OB-GYN PHYSICIAN:  Maxie Better, M.D.   SURGEON:  Ardeth Sportsman, M.D.   REASON FOR ADMISSION:  Abdominal pain, nausea, ileus versus partial  small bowel obstruction.   HISTORY OF PRESENT ILLNESS:  [name] is a 30 year old obese female,  otherwise relatively healthy, who is 3 days status post a laparoscopic  ventral hernia repair for periumbilical ventral hernia with diastases.  She required no lysis of adhesions and it was a relatively  straightforward case.  She did stay in the hospital for 2 days secondary  to postoperative pain issues and control, but was discharged by my  partner, yesterday.   The husband reports that since she has been home she has had worsening  abdominal pain and bloating.  She has not had a bowel movement since her  surgery and I think she was having flatus in the hospital, but has not  had it since she has been home.  She has not been wanting to eat or  drink anything and not been able to take much medication down.  Her pain  worsened to the point her husband brought her in based on concerns.  She  felt more distended and bloated as well.  There is some issue of her  having some emesis.  The ER was concerned perhaps there was some chest  discomfort but really she just had a lot of fullness and back pain as  well.  No hematochezia, melena or hematemesis, no dysphagia.   PAST MEDICAL HISTORY:  Gestational diabetes, class A2, status post  Cesarean section in January, 2010.   PAST SURGICAL HISTORY:  1. Cesarean section, January, 2010.  2. Wisdom teeth extraction.  3. Postoperative day #3 from  laparoscopic ventral hernia repair,      periumbilical hernia with mesh, as noted per HPI.   SOCIAL HISTORY:  She is married and in a stable relationship with an 74-  month-old child.  No tobacco, alcohol or drug use.   FAMILY HISTORY:  Notable for diabetes and hypertension.   REVIEW OF SYSTEMS:  CONSTITUTIONAL:  I could not get a history out of  her as she barely whispers with the NG tube in place.  I discussed with  her husband.  No weight gain or weight loss but she has had some  subjective chills.  HEENT:  Eyes negative. ENT negative.  CARDIAC:  No  exertional chest pain.  RESPIRATORY:  As noted per HPI with some  difficulty with pain with deep breaths and coughs.  ABDOMEN:  As noted  per HPI.  HEPATIC, RENAL, ENDOCRINE:  Negative except as noted in HPI  with endocrine noting that she had gestational diabetes.  MUSCULOSKELETAL:  No major joint pain or neck  pain.  PSYCHIATRIC:  She  has been somewhat sleepy but no major dementia or delirium according to  the husband.   PHYSICAL EXAMINATION:  VITAL SIGNS:  Her temperature is 98.4, pulse was  135 coming down to 106, respirations were 34 coming down to 20 after  getting some pain medications.  Her pain was 6 out of 10 down to about 5  out of 10.  She has 99% saturation on room air.  GENERAL:  She is a well-developed, well-nourished, overweight female  lying in bed, very sleepy.  PSYCHIATRIC:  She is of at least average to above average intelligence.  No evidence of dementia, neuropsychosis or paranoia but she is rather  sleepy right now and not too interactive at this time.  NEUROLOGICAL:  Cranial nerves II-XII are intact.  Hand grip is 5/5,  equal and symmetrical with no obvious resting or intention tremors.  Eyes:  When she opens them they are mildly injected but no icterus.  Pupils equal, round, reactive to light.  NECK:  Supple without any masses.  Trachea is midline.  LYMPH NODES:  No head, neck, axillary or groin  lymphadenopathy.  HEENT:  She is normocephalic.  Her mucous membranes are dry.  She has an  NG tube in place which she does not like.  Oropharynx is clear.  HEART:  Regular rate and rhythm, tachycardic but no murmurs, rubs or  gallops.  CHEST:  Decreased breath sounds at the bases but clear to auscultation  bilaterally.  ABDOMEN:  Markedly distended with some discomfort on palpation around  the edges.  It is hard for me to tell if she has true peritonitis.  Her  incisions are clean.  Her dressings are clean and intact.  Her incisions  are otherwise healed.  SKIN:  No obvious petechiae, no other sores or lesions.  Some bruising  around her laparoscopic site and dressings.  GYN:  Deferred.  RECTAL:  Deferred.   LABORATORY DATA:  Her white count is 13.6, hemoglobin 14.5.  Her BUN and  creatinine are 13 and 0.77 but her potassium is 5.7, bilirubin is 2.5  and alkaline phosphatase is 82, AST and ALT are 46 and 26, lipase at 14.  Urinalysis shows some protein and some ketones and bilirubin within in.   CLINICAL DATA:  A 3-way shows some decreased lung volumes.  She has some  dilated small bowel loops and air fluid levels but some colonic air,  seems like ileus versus a partial small bowel obstruction.   ASSESSMENT AND PLAN:  68. A 31 year old female on postoperative day #3 with worsening      abdominal pain, distention, chest discomfort.  Will admit with NG      tube to low intermittent wall suction.  CT scan of abdomen and      pelvis to rule out/better define what is going on as far as bowel      obstruction versus ileus.  2. Hyperkalemia.  Keep K out of medications, hydrate and re-evaluate.      Will discuss with the emergency room what interventions have been      done to help stabilize this.  3. Hyperbilirubinemia.  Of uncertain etiology to me.  Will need      followup after the CT scan.  If it persists or worsens, perhaps a      more formal ultrasound and bilirubin work up is  warranted.  4. Hold on antibiotics for now.  5. No non-steroidal's for now.  6. Follow closely.  If she does not improve, consider operative      exploration.   DISCUSSION:  The plan was discussed with the patient as well as the  patient's husband.  They expressed understanding and appreciation      Ardeth Sportsman, MD  Electronically Signed     SCG/MEDQ  D:  12/09/2008  T:  12/09/2008  Job:  [id number]

## 2010-10-16 NOTE — Discharge Summary (Signed)
NAMESHAMICA, [name]            ACCOUNT NO.:  [id number]   MEDICAL RECORD NO.:  [id number]          PATIENT TYPE:  INP   LOCATION:  9106                          FACILITY:  [facility]   PHYSICIAN:  Maxie Better, M.D.DATE OF BIRTH:  06/03/1980   DATE OF ADMISSION:  06/18/2008  DATE OF DISCHARGE:  06/21/2008                               DISCHARGE SUMMARY   ADMISSION DIAGNOSES:  1. Class A2 gestational diabetes.  2. Fetal macrosomia.  3. Term gestation.   DISCHARGE DIAGNOSIS:  1. Class A2 gestational diabetes.  2. Fetal macrosomia.  3. Term gestation, delivered.   PROCEDURE:  Primary cesarean section, Kerr hysterotomy.   HISTORY OF PRESENT ILLNESS:  A 30 year old gravida 1, para 0 female at  term with a class A2 gestational diabetes on glyburide admitted for  primary cesarean section due to estimated fetal weight of 9 pounds 13  ounces.   HOSPITAL COURSE:  The patient was admitted.  She underwent a primary  cesarean section, live female was delivered, 9 pounds 12 ounces, cord  around the neck and foot, Apgars 8 and 9, normal tubes and ovaries.  The  patient had an uncomplicated postoperative course.  Her CBC on postop  day #1 showed a hemoglobin of 11.2, hematocrit 32.6, white count of  15.9, platelet count of 190,000.  By postop day #3, the patient was  deemed well to be discharged home.  Incision had no evidence of  infection.  Due to fair amount of bleeding intraoperatively and oozing,  decision was made to perform a von Willebrand panel on this patient,  which was still pending at the time of discharge.   DISPOSITION:  Home.   CONDITION:  Stable.   DISCHARGE MEDICATIONS:  1. Motrin 600-800 mg every 8 hours p.r.n. pain.  2. Percocet 1-2 every 4-6 hours p.r.n. pain.  3. Prenatal vitamins 1 p.o. daily   FOLLOW-UP APPOINTMENTS:  [facility] 6 weeks.  Discharge  instructions per the postpartum booklet given.      Maxie Better, M.D.  Electronically  Signed     [place]/MEDQ  D:  07/14/2008  T:  07/15/2008  Job:  [id number]

## 2011-03-05 LAB — URINALYSIS, ROUTINE W REFLEX MICROSCOPIC
Glucose, UA: NEGATIVE mg/dL
Ketones, ur: NEGATIVE mg/dL
Protein, ur: NEGATIVE mg/dL

## 2011-03-05 LAB — URINE CULTURE: Colony Count: 3000

## 2013-02-14 ENCOUNTER — Other Ambulatory Visit: Payer: Self-pay | Admitting: Radiology

## 2013-04-06 LAB — OB RESULTS CONSOLE RPR: RPR: NONREACTIVE

## 2013-04-06 LAB — OB RESULTS CONSOLE RUBELLA ANTIBODY, IGM: RUBELLA: IMMUNE

## 2013-04-06 LAB — OB RESULTS CONSOLE ABO/RH: RH Type: POSITIVE

## 2013-04-06 LAB — OB RESULTS CONSOLE ANTIBODY SCREEN: Antibody Screen: NEGATIVE

## 2013-04-06 LAB — OB RESULTS CONSOLE HEPATITIS B SURFACE ANTIGEN: Hepatitis B Surface Ag: NEGATIVE

## 2013-04-06 LAB — OB RESULTS CONSOLE HIV ANTIBODY (ROUTINE TESTING): HIV: NONREACTIVE

## 2013-04-06 LAB — OB RESULTS CONSOLE GBS: GBS: POSITIVE

## 2013-04-13 LAB — OB RESULTS CONSOLE GC/CHLAMYDIA
Chlamydia: NEGATIVE
Gonorrhea: NEGATIVE

## 2013-08-22 ENCOUNTER — Encounter: Payer: 59 | Attending: Obstetrics and Gynecology

## 2013-09-27 ENCOUNTER — Encounter (HOSPITAL_COMMUNITY): Payer: Self-pay

## 2013-09-27 ENCOUNTER — Inpatient Hospital Stay (HOSPITAL_COMMUNITY): Payer: 59

## 2013-09-27 ENCOUNTER — Observation Stay (HOSPITAL_COMMUNITY)
Admission: AD | Admit: 2013-09-27 | Discharge: 2013-10-01 | Disposition: A | Discharge status: Home / Self Care | Payer: 59 | Source: Ambulatory Visit | Attending: Obstetrics and Gynecology | Admitting: Obstetrics and Gynecology

## 2013-09-27 DIAGNOSIS — Z3689 Encounter for other specified antenatal screening: Secondary | ICD-10-CM

## 2013-09-27 DIAGNOSIS — O368390 Maternal care for abnormalities of the fetal heart rate or rhythm, unspecified trimester, not applicable or unspecified: Secondary | ICD-10-CM | POA: Insufficient documentation

## 2013-09-27 DIAGNOSIS — O24419 Gestational diabetes mellitus in pregnancy, unspecified control: Secondary | ICD-10-CM

## 2013-09-27 DIAGNOSIS — O99810 Abnormal glucose complicating pregnancy: Principal | ICD-10-CM | POA: Insufficient documentation

## 2013-09-27 DIAGNOSIS — O34219 Maternal care for unspecified type scar from previous cesarean delivery: Secondary | ICD-10-CM | POA: Insufficient documentation

## 2013-09-27 DIAGNOSIS — O289 Unspecified abnormal findings on antenatal screening of mother: Secondary | ICD-10-CM

## 2013-09-27 DIAGNOSIS — Z3490 Encounter for supervision of normal pregnancy, unspecified, unspecified trimester: Secondary | ICD-10-CM

## 2013-09-27 LAB — CBC
HEMATOCRIT: 35 % — AB (ref 36.0–46.0)
HEMOGLOBIN: 12 g/dL (ref 12.0–15.0)
MCH: 30.5 pg (ref 26.0–34.0)
MCHC: 34.3 g/dL (ref 30.0–36.0)
MCV: 89.1 fL (ref 78.0–100.0)
Platelets: 260 10*3/uL (ref 150–400)
RBC: 3.93 MIL/uL (ref 3.87–5.11)
RDW: 13.7 % (ref 11.5–15.5)
WBC: 12.2 10*3/uL — ABNORMAL HIGH (ref 4.0–10.5)

## 2013-09-27 LAB — GLUCOSE, CAPILLARY: GLUCOSE-CAPILLARY: 105 mg/dL — AB (ref 70–99)

## 2013-09-27 MED ORDER — GLYBURIDE 5 MG PO TABS
7.5000 mg | ORAL_TABLET | Freq: Every day | ORAL | Status: DC
  Administered 2013-09-27 – 2013-09-30 (×4): 7.5 mg via ORAL
  Filled 2013-09-27 (×5): qty 1

## 2013-09-27 MED ORDER — PRENATAL MULTIVITAMIN CH
1.0000 | ORAL_TABLET | Freq: Every day | ORAL | Status: DC
  Administered 2013-09-27 – 2013-10-01 (×5): 1 via ORAL
  Filled 2013-09-27 (×5): qty 1

## 2013-09-27 MED ORDER — DOCUSATE SODIUM 100 MG PO CAPS
100.0000 mg | ORAL_CAPSULE | Freq: Every day | ORAL | Status: DC
  Administered 2013-09-28 – 2013-10-01 (×4): 100 mg via ORAL
  Filled 2013-09-27 (×6): qty 1

## 2013-09-27 MED ORDER — ACETAMINOPHEN 325 MG PO TABS
650.0000 mg | ORAL_TABLET | ORAL | Status: DC | PRN
  Administered 2013-09-29: 650 mg via ORAL
  Filled 2013-09-27: qty 2

## 2013-09-27 MED ORDER — CALCIUM CARBONATE ANTACID 500 MG PO CHEW: 2.0000 | CHEWABLE_TABLET | ORAL | Status: DC | PRN

## 2013-09-27 NOTE — H&P (Signed)
  OB ADMISSION/ HISTORY & PHYSICAL:  Admission Date: 09/27/2013  9:53 AM  Admit Diagnosis: 33.[redacted] weeks gestation, A2GDM  [name] is a 33 y.o. female presenting for fetal decelerations on NST x 2 1/2 mins  in office today. Sent to MAU for prolonged monitoring. NST reactive, BPP 6/10-fetal breathing not observed.    Prenatal History: G2P1001   EDC:11/09/2013, Alternate EDD Entry   [facility]  Primary Ob Provider: Dr. Cherly Hensenousins Prenatal course complicated by previous CS for macrosomia, A2GDM on Glyburide, abdominal hernia repair with mesh  Prenatal Labs: ABO, Rh:   O POS Antibody:  Neg Rubella:   Immune RPR:   NR HBsAg:   Neg HIV:   Neg GBS:   POS 1 hr GTT : 147, abnormal 3hr  Medical / Surgical History :  Past medical history: History reviewed. No pertinent past medical history.   Past surgical history:  Past Surgical History  Procedure Laterality Date  . Cesarean section    . Breast surgery      Family History: History reviewed. No pertinent family history.   Social History:  reports that she has never smoked. She does not have any smokeless tobacco history on file. She reports that she does not drink alcohol or use illicit drugs.  Allergies: Keflex   Current Medications at time of admission:  Prior to Admission medications   Medication Sig Start Date End Date Taking? Authorizing Provider  glyBURIDE (DIABETA) 5 MG tablet Take 7.5 mg by mouth daily after supper.   Yes Historical Provider, MD  Prenatal Vit-Fe Fumarate-FA (PRENATAL MULTIVITAMIN) TABS tablet Take 1 tablet by mouth daily at 12 noon.   Yes Historical Provider, MD     Review of Systems: Good FM Occ. BH ctx No VB No LOF No pain  Physical Exam:  VS: Blood pressure 133/82, pulse 99, temperature 98.4 F (36.9 C), temperature source Oral, resp. rate 16, height 5\' 3"  (1.6 m), weight 101.334 kg (223 lb 6.4 oz).  General: alert and oriented, appears comfortable   Heart: RRR Lungs: Clear lung fields Abdomen: Gravid, soft and non-tender, non-distended / uterus: gravid, non-tender Extremities: no edema  Genitalia / VE: deferred  FHR: baseline rate 135 / variability mod / accelerations + / no decelerations TOCO: irregular  Assessment: 33.[redacted] weeks gestation FHR category I A2GDM   Plan:  Admit for observation, continuous fetal monitoring, repeat BPP in am. Dr. Cherly Hensenousins notified of admission / plan of care   Lawernce PittsMelanie N Bhambri MSN, CNM 09/27/2013, 1:12 PM

## 2013-09-27 NOTE — MAU Provider Note (Signed)
  History     CSN: [id number]  Arrival date and time: 09/27/13 [id number] CNM at bedside @1028     Chief Complaint  Patient presents with  . Non-stress Test   HPI Comments: Pt is G2P1001 @33 .[redacted] wks gestation sent from office for prolonged monitoring after decels on NST. Pregnancy has been complicated by A2GDM on Glyburide, Polyhydramnios which resolved, and +GBS. H/o previous CS for macrosomia, and abdominal hernia repair with mesh.   OB History   Grav Para Term Preterm Abortions TAB SAB Ect Mult Living   2 1 1       1       History reviewed. No pertinent past medical history.  Past Surgical History  Procedure Laterality Date  . Cesarean section    . Breast surgery      History reviewed. No pertinent family history.  History  Substance Use Topics  . Smoking status: Never Smoker   . Smokeless tobacco: Not on file  . Alcohol Use: No    Allergies:  Allergies  Allergen Reactions  . Keflex [Cephalexin] Hives    No prescriptions prior to admission    Review of Systems  Constitutional: Negative.   HENT: Negative.   Eyes: Negative.   Respiratory: Negative.   Cardiovascular: Negative.   Gastrointestinal: Negative.   Genitourinary: Negative.        No VB, LOF, or ctx. Good FM.  Musculoskeletal: Negative.   Skin: Negative.   Neurological: Negative.   Endo/Heme/Allergies: Negative.   Psychiatric/Behavioral: Negative.    Physical Exam   Blood pressure 140/88, pulse 110, temperature 98.4 F (36.9 C), temperature source Oral, resp. rate 16, height 5\' 3"  (1.6 m), weight 101.334 kg (223 lb 6.4 oz).  Physical Exam  Constitutional: She is oriented to person, place, and time. She appears well-developed and well-nourished.  HENT:  Head: Normocephalic.  Neck: Normal range of motion.  Cardiovascular: Normal rate and regular rhythm.   Respiratory: Effort normal and breath sounds normal.  GI: Soft.  gravid  Genitourinary:  deferred  Musculoskeletal: Normal range of motion.   Neurological: She is alert and oriented to person, place, and time.  Skin: Skin is warm and dry.  Psychiatric: She has a normal mood and affect.   EFM: 135 bpm, mod variability, +accels, no decels Toco: irregular  MAU Course  Procedures  BPP-6/10 AFI-16 NST-reactive   Assessment and Plan  33.[redacted] weeks gestation A2GDM Reactive NST  Admit for obs to antepartum, continuous EFM, repeat BPP tomorrow.  Assessment and plan discussed with Dr. Cherly Hensenousins.   Lawernce PittsMelanie N Bhambri 09/27/2013, 10:38 AM

## 2013-09-27 NOTE — MAU Note (Signed)
Patient was seen in the office for a scheduled NST and was having decelerations. Sent to MAU for further monitoring. Patient reports good fetal movement, more at night. Mid contractions, no bleeding or leaking.

## 2013-09-28 ENCOUNTER — Inpatient Hospital Stay (HOSPITAL_COMMUNITY): Payer: 59

## 2013-09-28 ENCOUNTER — Observation Stay (HOSPITAL_COMMUNITY): Payer: 59

## 2013-09-28 LAB — GLUCOSE, CAPILLARY
GLUCOSE-CAPILLARY: 115 mg/dL — AB (ref 70–99)
Glucose-Capillary: 100 mg/dL — ABNORMAL HIGH (ref 70–99)
Glucose-Capillary: 85 mg/dL (ref 70–99)
Glucose-Capillary: 109 mg/dL — ABNORMAL HIGH (ref 70–99)

## 2013-09-28 NOTE — Progress Notes (Signed)
S: feels fine (+) FM  O; VSS afebrile Lungs clear to A Cor RRR ABd; gravid nontender Pelvic: deferred Ext +1 edema  Tracing: baseline 120 (+) accels 150 no decel BS: 63/79/105  IMP: Class A2 GDM. glyburide IUP @ 34 weeks w/ reassuring tracing overnight S/p BPP 6/8 P) repeat BPP. D/c home if remains reassuring. Keep OB appt next week.

## 2013-09-28 NOTE — Discharge Instructions (Signed)
PTL precautions, daily kick ct

## 2013-09-28 NOTE — Progress Notes (Signed)
Antenatal Nutrition Assessment:  Currently  [redacted] weeks gestation, adm for fetal monitoring Height  63 "  Weight 223 lbs  pre-pregnancy weight 210 lbs .  Pre-pregnancy  BMI 37.3  IBW 115 lbs Total weight gain 13.lbs Weight gain goals 11-20 lbs Estimated needs: 2000-2200 kcal/day, 70-80 grams protein/day, 2.3 liters fluid/day  carbohydrate modified gestational diet Current diet prescription will provide for increased needs. glyburide Nutrition related labs  CBG (last 3)   Recent Labs  09/27/13 2108 09/28/13 0605 09/28/13 1052  GLUCAP 105* 100* 85      Nutrition Dx: Increased nutrient needs r/t pregnancy and fetal growth requirements aeb [redacted] weeks gestation.  No educational needs assessed at this time.  Elisabeth CaraKatherine Accalia Rigdon M.Odis LusterEd. R.D. LDN Neonatal Nutrition Support Specialist Pager [phone]

## 2013-09-29 LAB — GLUCOSE, CAPILLARY
GLUCOSE-CAPILLARY: 98 mg/dL (ref 70–99)
GLUCOSE-CAPILLARY: 93 mg/dL (ref 70–99)
Glucose-Capillary: 83 mg/dL (ref 70–99)
Glucose-Capillary: 110 mg/dL — ABNORMAL HIGH (ref 70–99)

## 2013-09-29 LAB — ABO/RH: ABO/RH(D): O POS

## 2013-09-29 LAB — TYPE AND SCREEN
ABO/RH(D): O POS
Antibody Screen: NEGATIVE

## 2013-09-29 NOTE — Progress Notes (Signed)
S: feels fine (+) FM  O; VSS afebrile BP 116/69 T98.2 Lungs clear to A Cor RRR ABd; gravid nontender Pelvic: deferred Ext tr edema  Tracing: baseline 145 (+) accels small variables no ctx BS 85/109/115 Am: 98  IMP: Class A2 GDM. glyburide IUP @ 34 1/7 weeks  S/p BPP 6/8 x 2 Previous LTC/S  Desires VBAC  P) repeat BPP in am. Cont BS monitoring. Cont fetal monitoring To remain inpt per MFM until BPP improves

## 2013-09-30 ENCOUNTER — Observation Stay (HOSPITAL_COMMUNITY): Payer: 59

## 2013-09-30 LAB — GLUCOSE, CAPILLARY
GLUCOSE-CAPILLARY: 96 mg/dL (ref 70–99)
GLUCOSE-CAPILLARY: 76 mg/dL (ref 70–99)
Glucose-Capillary: 101 mg/dL — ABNORMAL HIGH (ref 70–99)
Glucose-Capillary: 90 mg/dL (ref 70–99)

## 2013-09-30 NOTE — Progress Notes (Signed)
Monitors off for transport to US 

## 2013-09-30 NOTE — Progress Notes (Signed)
Patient off the floor for her son's, [employer] Class in the education center.

## 2013-09-30 NOTE — Progress Notes (Signed)
HD # 4 S: feels fine (+) FM frustrated in having to be here  O; VSS afebrile BP 127/75 P94 Lungs clear to A Cor RRR ABd; gravid nontender Pelvic: deferred Ext tr edema  Tracing: baseline 145 (+) accels small variables no ctx BS  98 83/110 101/96  IMP: Class A2 GDM. glyburide IUP @ 34 2/7 weeks  Previous LTC/S  Desires VBAC S/p umbilical hernia with mesh repair Abnormal fetal testing  P) repeat BPP in am. Cont BS monitoring.  May need to increase glyburide. Cont fetal monitoring. Off monitor x 1 hr may ambulate To remain inpt per MFM until BPP improves in conjunction with tracing

## 2013-09-30 NOTE — Progress Notes (Signed)
Monitors reapplied after return from US.

## 2013-09-30 NOTE — Progress Notes (Signed)
Dietary at bedside discussing with patient her meal concerns. Also, patient expresses her frustrations with being in the hospital. Patient understands the reasons, but is still frustrated. Asked if she needed anything additional from our staff to improve her stay. She states she feels she is getting good care and does not desire anything additional from us at this point.  She knows she can be up for 1 hour per Dr. Cherly Hensenousins.

## 2013-10-01 ENCOUNTER — Ambulatory Visit (HOSPITAL_COMMUNITY): Payer: 59 | Attending: Obstetrics and Gynecology

## 2013-10-01 DIAGNOSIS — O99810 Abnormal glucose complicating pregnancy: Secondary | ICD-10-CM | POA: Insufficient documentation

## 2013-10-01 DIAGNOSIS — Z3689 Encounter for other specified antenatal screening: Secondary | ICD-10-CM | POA: Insufficient documentation

## 2013-10-01 DIAGNOSIS — O34219 Maternal care for unspecified type scar from previous cesarean delivery: Secondary | ICD-10-CM | POA: Insufficient documentation

## 2013-10-01 LAB — GLUCOSE, CAPILLARY
Glucose-Capillary: 91 mg/dL (ref 70–99)
Glucose-Capillary: 108 mg/dL — ABNORMAL HIGH (ref 70–99)

## 2013-10-01 NOTE — Progress Notes (Signed)
Unable to obtain a continuous tracing with RN at bedside.  Fetal movement palpated.  US monitor replaced

## 2013-10-01 NOTE — Progress Notes (Signed)
HD # 4 S: no complaint. Active fetus  O; VSS afebrile BP 131/84 Lungs clear to A Cor RRR ABd; gravid nontender Pelvic: deferred Ext no edema  BS 91/108 Tracing: baseline 145 (+) accels  No ctx  BPP 8/8 nl fluid  IMP: Class A2 GDM.on glyburide IUP @ 34 3/7 weeks  Previous LTC/S  Desires VBAC S/p umbilical hernia with mesh repair   P) d/c home. F/u appt 5/7. D/c instructions reviewed

## 2013-10-04 NOTE — Discharge Summary (Signed)
Physician Discharge Summary  Patient ID: Brenda BernheimCourtney G Milham MRN: [id number] DOB/AGE: 09-16-1980 32 y.o.  Admit date: 09/27/2013 Discharge date: 10/04/2013  Admission Diagnoses:Abnormal fetal testing, Class A2 GDM, previous C/S, IUP @ 33 6/7  Discharge Diagnoses: Abnormal fetal testing resolved, Class A2 GDM, IUP @ 34 6/7, previous cesarean section Principal Problem:   Abnormal antenatal test Active Problems:   Encounter for fetal anatomic survey   Discharged Condition: good  Hospital Course: Pt was admitted to antepartum service. She was placed on continuous monitoring and had several repeat BPP which was persistently 6/8 until the day of admission. Pt is Class A2 GDM on glyburide which she resumed and had BS monitoring done. The day of discharge NST reactive and BPP 8/8  Consults: None  Significant Diagnostic Studies: labs:  CBC    Component Value Date/Time   WBC 12.2* 09/27/2013 2135   RBC 3.93 09/27/2013 2135   HGB 12.0 09/27/2013 2135   HCT 35.0* 09/27/2013 2135   PLT 260 09/27/2013 2135   MCV 89.1 09/27/2013 2135   MCH 30.5 09/27/2013 2135   MCHC 34.3 09/27/2013 2135   RDW 13.7 09/27/2013 2135   LYMPHSABS 3.4 12/16/2008 0431   MONOABS 2.4* 12/16/2008 0431   EOSABS 0.3 12/16/2008 0431   BASOSABS 0.1 12/16/2008 0431      Treatments: fetal testing  Discharge Exam: Blood pressure 131/81, pulse 104, temperature 98.3 F (36.8 C), temperature source Oral, resp. rate 18, height 5\' 3"  (1.6 m), weight 101.334 kg (223 lb 6.4 oz). General appearance: alert, cooperative and no distress Resp: clear to auscultation bilaterally Cardio: regular rate and rhythm, S1, S2 normal, no murmur, click, rub or gallop Pelvic: deferred Extremities: no edema, redness or tenderness in the calves or thighs  Disposition: 01-Home or Self Care  Discharge Orders   Future Orders Complete By Expires   Discharge patient  As directed    Discharge patient  As directed        Medication List         glyBURIDE 5 MG tablet  Commonly known as:  DIABETA  Take 7.5 mg by mouth daily after supper.     prenatal multivitamin Tabs tablet  Take 1 tablet by mouth daily at 12 noon.           Follow-up Information   Follow up with Momin Misko A, MD On 10/02/2013.   Specialty:  Obstetrics and Gynecology   Contact information:   [address] [place] [id number] [phone]       Follow up On 10/05/2013.      Signed: Keiondre Colee A Aleyda Gindlesperger 10/04/2013, 4:30 AM

## 2013-10-23 ENCOUNTER — Inpatient Hospital Stay (HOSPITAL_COMMUNITY)
Admission: AD | Admit: 2013-10-23 | Discharge: 2013-10-23 | Disposition: A | Discharge status: Home / Self Care | Payer: 59 | Source: Ambulatory Visit | Attending: Obstetrics and Gynecology | Admitting: Obstetrics and Gynecology

## 2013-10-23 DIAGNOSIS — O99810 Abnormal glucose complicating pregnancy: Secondary | ICD-10-CM | POA: Insufficient documentation

## 2013-10-23 DIAGNOSIS — O368390 Maternal care for abnormalities of the fetal heart rate or rhythm, unspecified trimester, not applicable or unspecified: Secondary | ICD-10-CM | POA: Insufficient documentation

## 2013-10-23 NOTE — MAU Provider Note (Signed)
  History     CSN: [id number]  Arrival date and time: 10/23/13 1629   Chief Complaint  Patient presents with  . Decels. on EFM in office.    HPI  Active FM No ctx or cramping FHR decel on monitor during NST today  Past Medical History  Diagnosis Date  . Gestational diabetes     Past Surgical History  Procedure Laterality Date  . Cesarean section    . Breast surgery      Family History  Problem Relation Age of Onset  . Asthma Mother   . Diabetes Mother   . Hypertension Mother   . Diabetes Father   . Hypertension Father   . Asthma Son   . Arthritis Maternal Grandmother   . COPD Maternal Grandmother   . Heart disease Maternal Grandmother     History  Substance Use Topics  . Smoking status: Never Smoker   . Smokeless tobacco: Not on file  . Alcohol Use: No    Allergies:  Allergies  Allergen Reactions  . Keflex [Cephalexin] Hives    Prescriptions prior to admission  Medication Sig Dispense Refill  . glyBURIDE (DIABETA) 5 MG tablet Take 7.5 mg by mouth daily after supper.      . Prenatal Vit-Fe Fumarate-FA (PRENATAL MULTIVITAMIN) TABS tablet Take 1 tablet by mouth daily at 12 noon.        ROS Physical Exam   Blood pressure 136/89, pulse 116, temperature 98.6 F (37 C), temperature source Oral, resp. rate 20.  Physical Exam  MAU Course  Procedures  EFM - category 1             Reactive NST - no fetal decels  Assessment and Plan  37.4 weeks FHR monitoring GDMA2  1) DC home 2) daily FKC 3) keep next ROB with NST and BPP - Dr Michele Mcalpine 10/23/2013, 5:59 PM

## 2013-10-23 NOTE — MAU Note (Signed)
Sent from office. Had decels on NST in office. Patient is gestational diabetic, takes glyburide.

## 2013-10-31 ENCOUNTER — Telehealth (HOSPITAL_COMMUNITY): Payer: Self-pay | Admitting: *Deleted

## 2013-10-31 ENCOUNTER — Encounter (HOSPITAL_COMMUNITY): Payer: Self-pay | Admitting: *Deleted

## 2013-10-31 ENCOUNTER — Other Ambulatory Visit: Payer: Self-pay | Admitting: Obstetrics and Gynecology

## 2013-10-31 NOTE — Telephone Encounter (Signed)
Preadmission screen  

## 2013-11-02 ENCOUNTER — Inpatient Hospital Stay (HOSPITAL_COMMUNITY)
Admission: RE | Admit: 2013-11-02 | Discharge: 2013-11-05 | DRG: 765 | Disposition: A | Discharge status: Home / Self Care | Payer: 59 | Source: Ambulatory Visit | Attending: Obstetrics and Gynecology | Admitting: Obstetrics and Gynecology

## 2013-11-02 ENCOUNTER — Encounter (HOSPITAL_COMMUNITY): Payer: Self-pay

## 2013-11-02 DIAGNOSIS — O99214 Obesity complicating childbirth: Secondary | ICD-10-CM

## 2013-11-02 DIAGNOSIS — Z8249 Family history of ischemic heart disease and other diseases of the circulatory system: Secondary | ICD-10-CM

## 2013-11-02 DIAGNOSIS — O9989 Other specified diseases and conditions complicating pregnancy, childbirth and the puerperium: Secondary | ICD-10-CM

## 2013-11-02 DIAGNOSIS — O99814 Abnormal glucose complicating childbirth: Secondary | ICD-10-CM | POA: Diagnosis present

## 2013-11-02 DIAGNOSIS — O24419 Gestational diabetes mellitus in pregnancy, unspecified control: Secondary | ICD-10-CM

## 2013-11-02 DIAGNOSIS — O409XX0 Polyhydramnios, unspecified trimester, not applicable or unspecified: Secondary | ICD-10-CM | POA: Diagnosis present

## 2013-11-02 DIAGNOSIS — O99892 Other specified diseases and conditions complicating childbirth: Secondary | ICD-10-CM | POA: Diagnosis present

## 2013-11-02 DIAGNOSIS — O34219 Maternal care for unspecified type scar from previous cesarean delivery: Principal | ICD-10-CM | POA: Diagnosis present

## 2013-11-02 DIAGNOSIS — E669 Obesity, unspecified: Secondary | ICD-10-CM | POA: Diagnosis present

## 2013-11-02 DIAGNOSIS — Z98891 History of uterine scar from previous surgery: Secondary | ICD-10-CM

## 2013-11-02 DIAGNOSIS — Z6841 Body Mass Index (BMI) 40.0 and over, adult: Secondary | ICD-10-CM

## 2013-11-02 DIAGNOSIS — Z833 Family history of diabetes mellitus: Secondary | ICD-10-CM

## 2013-11-02 DIAGNOSIS — Z22330 Carrier of Group B streptococcus: Secondary | ICD-10-CM

## 2013-11-02 LAB — TYPE AND SCREEN
ABO/RH(D): O POS
Antibody Screen: NEGATIVE

## 2013-11-02 LAB — GLUCOSE, CAPILLARY
GLUCOSE-CAPILLARY: 77 mg/dL (ref 70–99)
GLUCOSE-CAPILLARY: 63 mg/dL — AB (ref 70–99)
GLUCOSE-CAPILLARY: 68 mg/dL — AB (ref 70–99)
GLUCOSE-CAPILLARY: 98 mg/dL (ref 70–99)
GLUCOSE-CAPILLARY: 75 mg/dL (ref 70–99)
Glucose-Capillary: 65 mg/dL — ABNORMAL LOW (ref 70–99)
Glucose-Capillary: 130 mg/dL — ABNORMAL HIGH (ref 70–99)
Glucose-Capillary: 100 mg/dL — ABNORMAL HIGH (ref 70–99)
Glucose-Capillary: 84 mg/dL (ref 70–99)

## 2013-11-02 LAB — CBC
HCT: 35.6 % — ABNORMAL LOW (ref 36.0–46.0)
Hemoglobin: 12.4 g/dL (ref 12.0–15.0)
MCH: 31 pg (ref 26.0–34.0)
MCHC: 34.8 g/dL (ref 30.0–36.0)
MCV: 89 fL (ref 78.0–100.0)
Platelets: 223 10*3/uL (ref 150–400)
RBC: 4 MIL/uL (ref 3.87–5.11)
RDW: 13.9 % (ref 11.5–15.5)
WBC: 11.7 10*3/uL — ABNORMAL HIGH (ref 4.0–10.5)

## 2013-11-02 LAB — RPR

## 2013-11-02 MED ORDER — LACTATED RINGERS IV SOLN: 500.0000 mL | Freq: Once | INTRAVENOUS | Status: DC

## 2013-11-02 MED ORDER — PHENYLEPHRINE 40 MCG/ML (10ML) SYRINGE FOR IV PUSH (FOR BLOOD PRESSURE SUPPORT): 80.0000 ug | PREFILLED_SYRINGE | INTRAVENOUS | Status: DC | PRN

## 2013-11-02 MED ORDER — OXYTOCIN BOLUS FROM INFUSION: 500.0000 mL | INTRAVENOUS | Status: DC

## 2013-11-02 MED ORDER — ONDANSETRON HCL 4 MG/2ML IJ SOLN
4.0000 mg | Freq: Four times a day (QID) | INTRAMUSCULAR | Status: DC | PRN
  Administered 2013-11-02: 4 mg via INTRAVENOUS
  Filled 2013-11-02: qty 2

## 2013-11-02 MED ORDER — OXYCODONE-ACETAMINOPHEN 5-325 MG PO TABS: 1.0000 | ORAL_TABLET | ORAL | Status: DC | PRN

## 2013-11-02 MED ORDER — FENTANYL 2.5 MCG/ML BUPIVACAINE 1/10 % EPIDURAL INFUSION (WH - ANES)
INTRAMUSCULAR | Status: AC
  Filled 2013-11-02: qty 125

## 2013-11-02 MED ORDER — CITRIC ACID-SODIUM CITRATE 334-500 MG/5ML PO SOLN
30.0000 mL | ORAL | Status: DC | PRN
  Administered 2013-11-03: 30 mL via ORAL
  Filled 2013-11-02 (×3): qty 15

## 2013-11-02 MED ORDER — PHENYLEPHRINE 40 MCG/ML (10ML) SYRINGE FOR IV PUSH (FOR BLOOD PRESSURE SUPPORT)
PREFILLED_SYRINGE | INTRAVENOUS | Status: AC
  Filled 2013-11-02: qty 10

## 2013-11-02 MED ORDER — LIDOCAINE HCL (PF) 1 % IJ SOLN: 30.0000 mL | INTRAMUSCULAR | Status: DC | PRN

## 2013-11-02 MED ORDER — EPHEDRINE 5 MG/ML INJ: 10.0000 mg | INTRAVENOUS | Status: DC | PRN

## 2013-11-02 MED ORDER — EPHEDRINE 5 MG/ML INJ
INTRAVENOUS | Status: AC
  Filled 2013-11-02: qty 4

## 2013-11-02 MED ORDER — IBUPROFEN 600 MG PO TABS: 600.0000 mg | ORAL_TABLET | Freq: Four times a day (QID) | ORAL | Status: DC | PRN

## 2013-11-02 MED ORDER — OXYTOCIN 40 UNITS IN LACTATED RINGERS INFUSION - SIMPLE MED: 62.5000 mL/h | INTRAVENOUS | Status: DC

## 2013-11-02 MED ORDER — CLINDAMYCIN PHOSPHATE 900 MG/50ML IV SOLN
900.0000 mg | Freq: Three times a day (TID) | INTRAVENOUS | Status: DC
  Administered 2013-11-02 – 2013-11-03 (×4): 900 mg via INTRAVENOUS
  Filled 2013-11-02 (×5): qty 50

## 2013-11-02 MED ORDER — FENTANYL 2.5 MCG/ML BUPIVACAINE 1/10 % EPIDURAL INFUSION (WH - ANES): 14.0000 mL/h | INTRAMUSCULAR | Status: DC | PRN

## 2013-11-02 MED ORDER — OXYTOCIN 40 UNITS IN LACTATED RINGERS INFUSION - SIMPLE MED
1.0000 m[IU]/min | INTRAVENOUS | Status: DC
Start: 2013-11-02 — End: 2013-11-03
  Administered 2013-11-02: 2 m[IU]/min via INTRAVENOUS
  Filled 2013-11-02: qty 1000

## 2013-11-02 MED ORDER — LACTATED RINGERS IV SOLN: 500.0000 mL | INTRAVENOUS | Status: DC | PRN

## 2013-11-02 MED ORDER — ACETAMINOPHEN 325 MG PO TABS: 650.0000 mg | ORAL_TABLET | ORAL | Status: DC | PRN

## 2013-11-02 MED ORDER — BUTORPHANOL TARTRATE 1 MG/ML IJ SOLN
1.0000 mg | INTRAMUSCULAR | Status: DC | PRN
  Administered 2013-11-02 (×2): 1 mg via INTRAVENOUS
  Filled 2013-11-02 (×2): qty 1

## 2013-11-02 MED ORDER — DIPHENHYDRAMINE HCL 50 MG/ML IJ SOLN: 12.5000 mg | INTRAMUSCULAR | Status: DC | PRN

## 2013-11-02 MED ORDER — TERBUTALINE SULFATE 1 MG/ML IJ SOLN: 0.2500 mg | Freq: Once | INTRAMUSCULAR | Status: AC | PRN

## 2013-11-02 MED ORDER — OXYTOCIN 10 UNIT/ML IJ SOLN: 10.0000 [IU] | Freq: Once | INTRAMUSCULAR | Status: DC

## 2013-11-02 MED ORDER — LACTATED RINGERS IV SOLN
INTRAVENOUS | Status: DC
  Administered 2013-11-02 – 2013-11-03 (×4): via INTRAVENOUS

## 2013-11-02 NOTE — Progress Notes (Signed)
Pt here for IOL  O; VE: closed /60%/-3  vtx deviated to right  Intracervical balloon placed  Tracing: baseline 125 (+) accels to 140 irreg ctx  IMP: Class A2 GDM Previous LTCS Polyhydramnios Hx umb hernia repair with mesh GBS cx (+)  P) exaggerated left sims with peanut ball. Start Pitocin. Also to start IV clindamycin. Wait for balloon to come out

## 2013-11-02 NOTE — Progress Notes (Addendum)
S: came to assess pt having had intracervical balloon since am Pt dozing with pretzel bag in hand  O:  Pitocin VE  3/100/-3 balloon in place  Tracing: baseline 120 (+) accels Ctx q 2-3 mins  IMP: latent phase Class A2 GDM Previous C/S Term gestation GBS cx (+) Polyhydramnios P) pt reports she no longer desires to try VBAC as she is constrained with monitoring and not able to move around. Pt advised she may have a repeat C/S however need to check with anesthesiologist in light of pretzels . Reassuring fetal monitoring. Reviewed  Surgical risk of infection, bleeding, injury to bowel, ureter,bladder, poss blood transfusion and its risk. Consent signed. D/c pitocin and removal of balloon. Cont clindamycin. Await time for procedure.. Remain NPO   Addendum: spoke with anesthesiologist. In light of elective procedure and violation of eating policy, would need to wait until 11 pm.  Given the time and nonurgent, will sched C/S in am unless position changes during the night

## 2013-11-02 NOTE — H&P (Signed)
[name] is a 33 y.o. female presenting for IOL @ 39  wks 2nd to Class A2 GDM using glyburide. Last sono showed polyhydramnios.  EFW last week was 8lb 6 oz.  Hx notable for previous LTCS with strong desire for VBAC. Pt also has umbilical hernia repair with mesh History OB History   Grav Para Term Preterm Abortions TAB SAB Ect Mult Living   2 1 1       1      Past Medical History  Diagnosis Date  . Gestational diabetes    Past Surgical History  Procedure Laterality Date  . Cesarean section    . Breast surgery    . Hernia repair    . Bowel obstruction     Family History: family history includes Arthritis in her maternal grandmother; Asthma in her mother and son; COPD in her maternal grandmother; Diabetes in her father and mother; Heart disease in her maternal grandmother; Hypertension in her father and mother. Social History:  reports that she has never smoked. She does not have any smokeless tobacco history on file. She reports that she does not drink alcohol or use illicit drugs.   Prenatal Transfer Tool  Maternal Diabetes: Yes:  Diabetes Type:  Insulin/Medication controlled Genetic Screening: Normal Maternal Ultrasounds/Referrals: Normal Fetal Ultrasounds or other Referrals:  Fetal echo Maternal Substance Abuse:  No Significant Maternal Medications:  Meds include: Other:  Significant Maternal Lab Results:  Lab values include: Group B Strep positive Other Comments:  polyhydramnios, glyburide  ROS neg    Blood pressure 126/83, pulse 100, temperature 98.3 F (36.8 C), temperature source Oral, resp. rate 18, height 5\' 3"  (1.6 m), weight 105.688 kg (233 lb). Exam Physical Exam  Constitutional: She is oriented to person, place, and time. She appears well-developed and well-nourished.  HENT:  Head: Atraumatic.  Neck: Neck supple.  Respiratory: Breath sounds normal.  GI: Soft.  Musculoskeletal: Normal range of motion. She exhibits no edema.  Neurological: She is alert  and oriented to person, place, and time.  Skin: Skin is warm and dry.  Psychiatric: She has a normal mood and affect.   VE closed/60%/-4 vtx Prenatal labs: ABO, Rh: --/--/O POS, O POS (05/02 0950) Antibody: NEG (05/02 0950) Rubella: Immune (11/07 0000) RPR: Nonreactive (11/07 0000)  HBsAg: Negative (11/07 0000)  HIV: Non-reactive (11/07 0000)  GBS: Positive (11/07 0000)   Assessment/Plan: Class A2 GDM GBS cx (+) Previous LTCS Term gestation Hx umb hernia repair with mesh polyhydramnios P) admit. Intracervical balloon. Pitocin. Clindamycin. BS q 2hrs routine labs. Analgesic/epidural prn [name] A [name] 11/02/2013, 8:02 AM

## 2013-11-03 ENCOUNTER — Encounter (HOSPITAL_COMMUNITY)
Admission: RE | Disposition: A | Discharge status: Home / Self Care | Payer: Self-pay | Source: Ambulatory Visit | Attending: Obstetrics and Gynecology

## 2013-11-03 ENCOUNTER — Encounter (HOSPITAL_COMMUNITY): Payer: 59 | Admitting: Anesthesiology

## 2013-11-03 ENCOUNTER — Inpatient Hospital Stay (HOSPITAL_COMMUNITY): Payer: 59 | Admitting: Anesthesiology

## 2013-11-03 ENCOUNTER — Encounter (HOSPITAL_COMMUNITY): Payer: Self-pay

## 2013-11-03 DIAGNOSIS — Z98891 History of uterine scar from previous surgery: Secondary | ICD-10-CM

## 2013-11-03 LAB — GLUCOSE, CAPILLARY
GLUCOSE-CAPILLARY: 104 mg/dL — AB (ref 70–99)
GLUCOSE-CAPILLARY: 88 mg/dL (ref 70–99)
GLUCOSE-CAPILLARY: 88 mg/dL (ref 70–99)
GLUCOSE-CAPILLARY: 92 mg/dL (ref 70–99)
GLUCOSE-CAPILLARY: 96 mg/dL (ref 70–99)
Glucose-Capillary: 80 mg/dL (ref 70–99)

## 2013-11-03 SURGERY — Surgical Case
Anesthesia: Spinal

## 2013-11-03 MED ORDER — ONDANSETRON HCL 4 MG/2ML IJ SOLN
INTRAMUSCULAR | Status: DC | PRN
  Administered 2013-11-03: 4 mg via INTRAVENOUS

## 2013-11-03 MED ORDER — FENTANYL CITRATE 0.05 MG/ML IJ SOLN
INTRAMUSCULAR | Status: AC
  Filled 2013-11-03: qty 2

## 2013-11-03 MED ORDER — OXYTOCIN 10 UNIT/ML IJ SOLN
INTRAMUSCULAR | Status: AC
  Filled 2013-11-03: qty 4

## 2013-11-03 MED ORDER — MENTHOL 3 MG MT LOZG: 1.0000 | LOZENGE | OROMUCOSAL | Status: DC | PRN

## 2013-11-03 MED ORDER — DIBUCAINE 1 % RE OINT: 1.0000 "application " | TOPICAL_OINTMENT | RECTAL | Status: DC | PRN

## 2013-11-03 MED ORDER — FENTANYL CITRATE 0.05 MG/ML IJ SOLN
INTRAMUSCULAR | Status: DC | PRN
  Administered 2013-11-03: 12.5 ug via INTRATHECAL

## 2013-11-03 MED ORDER — SIMETHICONE 80 MG PO CHEW
80.0000 mg | CHEWABLE_TABLET | Freq: Three times a day (TID) | ORAL | Status: DC
  Administered 2013-11-03 – 2013-11-05 (×5): 80 mg via ORAL
  Filled 2013-11-03 (×6): qty 1

## 2013-11-03 MED ORDER — SCOPOLAMINE 1 MG/3DAYS TD PT72
1.0000 | MEDICATED_PATCH | Freq: Once | TRANSDERMAL | Status: DC
  Administered 2013-11-03: 1.5 mg via TRANSDERMAL

## 2013-11-03 MED ORDER — DIPHENHYDRAMINE HCL 25 MG PO CAPS
25.0000 mg | ORAL_CAPSULE | Freq: Four times a day (QID) | ORAL | Status: DC | PRN
Start: 2013-11-03 — End: 2013-11-05

## 2013-11-03 MED ORDER — LACTATED RINGERS IV SOLN
INTRAVENOUS | Status: DC
  Administered 2013-11-03: 18:00:00 via INTRAVENOUS

## 2013-11-03 MED ORDER — BUPIVACAINE HCL (PF) 0.25 % IJ SOLN
INTRAMUSCULAR | Status: DC | PRN
  Administered 2013-11-03: 8 mL

## 2013-11-03 MED ORDER — SIMETHICONE 80 MG PO CHEW: 80.0000 mg | CHEWABLE_TABLET | ORAL | Status: DC | PRN

## 2013-11-03 MED ORDER — SODIUM CHLORIDE 0.9 % IV SOLN: 250.0000 mL | INTRAVENOUS | Status: DC

## 2013-11-03 MED ORDER — GENTAMICIN SULFATE 40 MG/ML IJ SOLN
Freq: Once | INTRAVENOUS | Status: DC
  Filled 2013-11-03: qty 9.25

## 2013-11-03 MED ORDER — PHENYLEPHRINE 8 MG IN D5W 100 ML (0.08MG/ML) PREMIX OPTIME
INJECTION | INTRAVENOUS | Status: AC
  Filled 2013-11-03: qty 100

## 2013-11-03 MED ORDER — BISACODYL 10 MG RE SUPP: 10.0000 mg | Freq: Every day | RECTAL | Status: DC | PRN

## 2013-11-03 MED ORDER — OXYTOCIN 10 UNIT/ML IJ SOLN
40.0000 [IU] | INTRAVENOUS | Status: DC | PRN
  Administered 2013-11-03: 40 [IU] via INTRAVENOUS

## 2013-11-03 MED ORDER — MORPHINE SULFATE (PF) 0.5 MG/ML IJ SOLN
INTRAMUSCULAR | Status: DC | PRN
  Administered 2013-11-03: .1 mg via INTRATHECAL

## 2013-11-03 MED ORDER — MEPERIDINE HCL 25 MG/ML IJ SOLN: 6.2500 mg | INTRAMUSCULAR | Status: DC | PRN

## 2013-11-03 MED ORDER — SIMETHICONE 80 MG PO CHEW
80.0000 mg | CHEWABLE_TABLET | ORAL | Status: DC
  Administered 2013-11-04 (×2): 80 mg via ORAL
  Filled 2013-11-03 (×2): qty 1

## 2013-11-03 MED ORDER — SODIUM CHLORIDE 0.9 % IJ SOLN: 3.0000 mL | INTRAMUSCULAR | Status: DC | PRN

## 2013-11-03 MED ORDER — BUPIVACAINE HCL (PF) 0.25 % IJ SOLN
INTRAMUSCULAR | Status: AC
  Filled 2013-11-03: qty 30

## 2013-11-03 MED ORDER — LACTATED RINGERS IV SOLN
INTRAVENOUS | Status: DC | PRN
  Administered 2013-11-03: 10:00:00 via INTRAVENOUS

## 2013-11-03 MED ORDER — NALOXONE HCL 1 MG/ML IJ SOLN
1.0000 ug/kg/h | INTRAVENOUS | Status: DC | PRN
  Filled 2013-11-03: qty 2

## 2013-11-03 MED ORDER — KETOROLAC TROMETHAMINE 30 MG/ML IJ SOLN: 30.0000 mg | Freq: Four times a day (QID) | INTRAMUSCULAR | Status: DC | PRN

## 2013-11-03 MED ORDER — ZOLPIDEM TARTRATE 5 MG PO TABS: 5.0000 mg | ORAL_TABLET | Freq: Every evening | ORAL | Status: DC | PRN

## 2013-11-03 MED ORDER — KETOROLAC TROMETHAMINE 60 MG/2ML IM SOLN
60.0000 mg | Freq: Once | INTRAMUSCULAR | Status: AC | PRN
  Filled 2013-11-03: qty 2

## 2013-11-03 MED ORDER — SODIUM CHLORIDE 0.9 % IJ SOLN: 3.0000 mL | Freq: Two times a day (BID) | INTRAMUSCULAR | Status: DC

## 2013-11-03 MED ORDER — SCOPOLAMINE 1 MG/3DAYS TD PT72
MEDICATED_PATCH | TRANSDERMAL | Status: AC
  Filled 2013-11-03: qty 1

## 2013-11-03 MED ORDER — ONDANSETRON HCL 4 MG/2ML IJ SOLN: 4.0000 mg | INTRAMUSCULAR | Status: DC | PRN

## 2013-11-03 MED ORDER — ONDANSETRON HCL 4 MG/2ML IJ SOLN
INTRAMUSCULAR | Status: AC
  Filled 2013-11-03: qty 2

## 2013-11-03 MED ORDER — OXYTOCIN 40 UNITS IN LACTATED RINGERS INFUSION - SIMPLE MED: 62.5000 mL/h | INTRAVENOUS | Status: AC

## 2013-11-03 MED ORDER — METHYLERGONOVINE MALEATE 0.2 MG/ML IJ SOLN: 0.2000 mg | INTRAMUSCULAR | Status: DC | PRN

## 2013-11-03 MED ORDER — METHYLERGONOVINE MALEATE 0.2 MG PO TABS: 0.2000 mg | ORAL_TABLET | ORAL | Status: DC | PRN

## 2013-11-03 MED ORDER — DIPHENHYDRAMINE HCL 25 MG PO CAPS: 25.0000 mg | ORAL_CAPSULE | ORAL | Status: DC | PRN

## 2013-11-03 MED ORDER — GENTAMICIN SULFATE 40 MG/ML IJ SOLN
370.0000 mg | Freq: Once | INTRAVENOUS | Status: AC
  Administered 2013-11-03: 370 mg via INTRAVENOUS
  Filled 2013-11-03: qty 9.25

## 2013-11-03 MED ORDER — ONDANSETRON HCL 4 MG PO TABS: 4.0000 mg | ORAL_TABLET | ORAL | Status: DC | PRN

## 2013-11-03 MED ORDER — OXYCODONE-ACETAMINOPHEN 5-325 MG PO TABS: 1.0000 | ORAL_TABLET | ORAL | Status: DC | PRN

## 2013-11-03 MED ORDER — PRENATAL MULTIVITAMIN CH
1.0000 | ORAL_TABLET | Freq: Every day | ORAL | Status: DC
  Administered 2013-11-04 – 2013-11-05 (×2): 1 via ORAL
  Filled 2013-11-03 (×2): qty 1

## 2013-11-03 MED ORDER — LANOLIN HYDROUS EX OINT: 1.0000 "application " | TOPICAL_OINTMENT | CUTANEOUS | Status: DC | PRN

## 2013-11-03 MED ORDER — LACTATED RINGERS IV SOLN: INTRAVENOUS | Status: DC

## 2013-11-03 MED ORDER — NALOXONE HCL 0.4 MG/ML IJ SOLN: 0.4000 mg | INTRAMUSCULAR | Status: DC | PRN

## 2013-11-03 MED ORDER — PHENYLEPHRINE 8 MG IN D5W 100 ML (0.08MG/ML) PREMIX OPTIME
INJECTION | INTRAVENOUS | Status: DC | PRN
  Administered 2013-11-03: 60 ug/min via INTRAVENOUS

## 2013-11-03 MED ORDER — DIPHENHYDRAMINE HCL 50 MG/ML IJ SOLN
12.5000 mg | INTRAMUSCULAR | Status: DC | PRN
  Administered 2013-11-03: 12.5 mg via INTRAVENOUS
  Filled 2013-11-03: qty 1

## 2013-11-03 MED ORDER — BUPIVACAINE IN DEXTROSE 0.75-8.25 % IT SOLN
INTRATHECAL | Status: DC | PRN
  Administered 2013-11-03: 1.5 mL via INTRATHECAL

## 2013-11-03 MED ORDER — SENNOSIDES-DOCUSATE SODIUM 8.6-50 MG PO TABS
2.0000 | ORAL_TABLET | ORAL | Status: DC
  Administered 2013-11-04 (×2): 2 via ORAL
  Filled 2013-11-03 (×2): qty 2

## 2013-11-03 MED ORDER — FERROUS SULFATE 325 (65 FE) MG PO TABS
325.0000 mg | ORAL_TABLET | Freq: Two times a day (BID) | ORAL | Status: DC
  Administered 2013-11-04: 325 mg via ORAL
  Filled 2013-11-03: qty 1

## 2013-11-03 MED ORDER — FLEET ENEMA 7-19 GM/118ML RE ENEM: 1.0000 | ENEMA | Freq: Every day | RECTAL | Status: DC | PRN

## 2013-11-03 MED ORDER — NALBUPHINE HCL 10 MG/ML IJ SOLN: 5.0000 mg | INTRAMUSCULAR | Status: DC | PRN

## 2013-11-03 MED ORDER — WITCH HAZEL-GLYCERIN EX PADS: 1.0000 "application " | MEDICATED_PAD | CUTANEOUS | Status: DC | PRN

## 2013-11-03 MED ORDER — METOCLOPRAMIDE HCL 5 MG/ML IJ SOLN: 10.0000 mg | Freq: Three times a day (TID) | INTRAMUSCULAR | Status: DC | PRN

## 2013-11-03 MED ORDER — FENTANYL CITRATE 0.05 MG/ML IJ SOLN: 25.0000 ug | INTRAMUSCULAR | Status: DC | PRN

## 2013-11-03 MED ORDER — IBUPROFEN 600 MG PO TABS
600.0000 mg | ORAL_TABLET | Freq: Four times a day (QID) | ORAL | Status: DC
  Administered 2013-11-03 – 2013-11-05 (×7): 600 mg via ORAL
  Filled 2013-11-03 (×7): qty 1

## 2013-11-03 MED ORDER — ONDANSETRON HCL 4 MG/2ML IJ SOLN: 4.0000 mg | Freq: Three times a day (TID) | INTRAMUSCULAR | Status: DC | PRN

## 2013-11-03 MED ORDER — MORPHINE SULFATE 0.5 MG/ML IJ SOLN
INTRAMUSCULAR | Status: AC
  Filled 2013-11-03: qty 10

## 2013-11-03 MED ORDER — DIPHENHYDRAMINE HCL 50 MG/ML IJ SOLN: 25.0000 mg | INTRAMUSCULAR | Status: DC | PRN

## 2013-11-03 SURGICAL SUPPLY — 47 items
APL SKNCLS STERI-STRIP NONHPOA (GAUZE/BANDAGES/DRESSINGS)
BARRIER ADHS 3X4 INTERCEED (GAUZE/BANDAGES/DRESSINGS) ×5 IMPLANT
BENZOIN TINCTURE PRP APPL 2/3 (GAUZE/BANDAGES/DRESSINGS) IMPLANT
BRR ADH 4X3 ABS CNTRL BYND (GAUZE/BANDAGES/DRESSINGS) ×2
CLAMP CORD UMBIL (MISCELLANEOUS) IMPLANT
CLOSURE WOUND 1/2 X4 (GAUZE/BANDAGES/DRESSINGS)
CLOTH BEACON ORANGE TIMEOUT ST (SAFETY) ×3 IMPLANT
CONTAINER PREFILL 10% NBF 15ML (MISCELLANEOUS) IMPLANT
DRAPE LG THREE QUARTER DISP (DRAPES) IMPLANT
DRSG OPSITE POSTOP 4X10 (GAUZE/BANDAGES/DRESSINGS) ×5 IMPLANT
DURAPREP 26ML APPLICATOR (WOUND CARE) ×3 IMPLANT
ELECT REM PT RETURN 9FT ADLT (ELECTROSURGICAL) ×3
ELECTRODE REM PT RTRN 9FT ADLT (ELECTROSURGICAL) ×1 IMPLANT
EXTRACTOR VACUUM M CUP 4 TUBE (SUCTIONS) ×1 IMPLANT
EXTRACTOR VACUUM M CUP 4' TUBE (SUCTIONS) ×1
GLOVE BIOGEL PI IND STRL 7.0 (GLOVE) ×1 IMPLANT
GLOVE BIOGEL PI INDICATOR 7.0 (GLOVE) ×2
GLOVE ECLIPSE 6.5 STRL STRAW (GLOVE) ×3 IMPLANT
GOWN STRL REUS W/TWL LRG LVL3 (GOWN DISPOSABLE) ×6 IMPLANT
KIT ABG SYR 3ML LUER SLIP (SYRINGE) IMPLANT
NDL HYPO 25X1 1.5 SAFETY (NEEDLE) ×1 IMPLANT
NDL HYPO 25X5/8 SAFETYGLIDE (NEEDLE) IMPLANT
NEEDLE HYPO 25X1 1.5 SAFETY (NEEDLE) ×3 IMPLANT
NEEDLE HYPO 25X5/8 SAFETYGLIDE (NEEDLE) IMPLANT
NS IRRIG 1000ML POUR BTL (IV SOLUTION) ×3 IMPLANT
PACK C SECTION WH (CUSTOM PROCEDURE TRAY) ×3 IMPLANT
PAD OB MATERNITY 4.3X12.25 (PERSONAL CARE ITEMS) ×3 IMPLANT
RTRCTR C-SECT PINK 25CM LRG (MISCELLANEOUS) IMPLANT
STAPLER VISISTAT 35W (STAPLE) ×2 IMPLANT
STRIP CLOSURE SKIN 1/2X4 (GAUZE/BANDAGES/DRESSINGS) IMPLANT
SUT CHROMIC GUT AB #0 18 (SUTURE) IMPLANT
SUT MNCRL 0 VIOLET CTX 36 (SUTURE) ×3 IMPLANT
SUT MON AB 4-0 PS1 27 (SUTURE) IMPLANT
SUT MONOCRYL 0 CTX 36 (SUTURE) ×6
SUT PLAIN 2 0 (SUTURE)
SUT PLAIN 2 0 XLH (SUTURE) IMPLANT
SUT PLAIN ABS 2-0 CT1 27XMFL (SUTURE) IMPLANT
SUT VIC AB 0 CT1 36 (SUTURE) ×6 IMPLANT
SUT VIC AB 2-0 CT1 27 (SUTURE) ×3
SUT VIC AB 2-0 CT1 TAPERPNT 27 (SUTURE) ×1 IMPLANT
SUT VIC AB 3-0 SH 27 (SUTURE) ×3
SUT VIC AB 3-0 SH 27X BRD (SUTURE) IMPLANT
SUT VIC AB 4-0 PS2 27 (SUTURE) IMPLANT
SYR CONTROL 10ML LL (SYRINGE) ×3 IMPLANT
TOWEL OR 17X24 6PK STRL BLUE (TOWEL DISPOSABLE) ×3 IMPLANT
TRAY FOLEY CATH 14FR (SET/KITS/TRAYS/PACK) IMPLANT
WATER STERILE IRR 1000ML POUR (IV SOLUTION) ×3 IMPLANT

## 2013-11-03 NOTE — Anesthesia Postprocedure Evaluation (Signed)
  Anesthesia Post-op Note  Patient: [name]  Procedure(s) Performed: Procedure(s) (LRB): CESAREAN SECTION (N/A)  Patient Location: PACU  Anesthesia Type: Spinal  Level of Consciousness: awake and alert   Airway and Oxygen Therapy: Patient Spontanous Breathing  Post-op Pain: mild  Post-op Assessment: Post-op Vital signs reviewed, Patient's Cardiovascular Status Stable, Respiratory Function Stable, Patent Airway and No signs of Nausea or vomiting  Last Vitals:  Filed Vitals:   11/03/13 1115  BP:   Pulse:   Temp:   Resp: 16    Post-op Vital Signs: stable   Complications: No apparent anesthesia complications

## 2013-11-03 NOTE — Progress Notes (Signed)
Patient continues to move ultrasound and toco making FHR and contraction tracing impossible.  Have asked patient repeatedly not to move monitors but she does so almost as soon as I leave the room.  At 1201 entered room, asked patient if she would like to get up to bathroom, patient refused. Repositioned monitors again, left room at 12:12.  At 12:14 patient off monitor to go to bathroom.

## 2013-11-03 NOTE — Addendum Note (Signed)
Addendum created 11/03/13 1757 by Angela Adam, CRNA   Modules edited: Notes Section   Notes Section:  File: [id number]

## 2013-11-03 NOTE — Anesthesia Postprocedure Evaluation (Signed)
  Anesthesia Post-op Note  Patient: [name]  Procedure(s) Performed: Procedure(s): CESAREAN SECTION (N/A)  Patient Location: Mother/Baby  Anesthesia Type:Spinal  Level of Consciousness: awake and alert   Airway and Oxygen Therapy: Patient Spontanous Breathing  Post-op Pain: mild  Post-op Assessment: Post-op Vital signs reviewed, Patient's Cardiovascular Status Stable, Respiratory Function Stable, No signs of Nausea or vomiting, Pain level controlled, No headache, No residual numbness and No residual motor weakness  Post-op Vital Signs: Reviewed  Last Vitals:  Filed Vitals:   11/03/13 1659  BP: 122/79  Pulse: 109  Temp:   Resp:     Complications: No apparent anesthesia complications

## 2013-11-03 NOTE — Lactation Note (Signed)
This note was copied from the chart of [name]. Lactation Consultation Note  Patient Name: [name] SHFWY'[id number] Date: 11/03/2013 Reason for consult: Initial assessment;Other (Comment) (baby born to mom on glyburide) with blood sugars all wnl but baby has received some formula feedings and mom was provided with DEBP for additional stimulation of milk production.  LC able to assist with latch to (L) breast in football position and after several shallow latches, she finally grasped areola deeply and able to suck rhythmically with audible swallows for >10 minutes.  FOB at bedside and assisting.  Parents to inform RN of total feeding time after completed.   Maternal Data Formula Feeding for Exclusion: Yes Reason for exclusion: Previous breast surgery (mastectomy, reduction, or augmentation where mother is unable to produce breast milk) (mother had reduction surgery in 2006) Infant to breast within first hour of birth: Yes Has patient been taught Hand Expression?: Yes Johnny Bridge, RN documented w/feedings; reinforced by [facility]) Does the patient have breastfeeding experience prior to this delivery?: Yes  Feeding Feeding Type: Breast Fed  LATCH Score/Interventions Latch: Repeated attempts needed to sustain latch, nipple held in mouth throughout feeding, stimulation needed to elicit sucking reflex. (needed several attempts but nipples evert slightly/breast compressible) Intervention(s): Adjust position;Assist with latch;Breast compression  Audible Swallowing: Spontaneous and intermittent Intervention(s): Skin to skin;Hand expression Intervention(s): Skin to skin;Hand expression;Alternate breast massage  Type of Nipple: Everted at rest and after stimulation (nipples short and soft but everted) Intervention(s): Double electric pump  Comfort (Breast/Nipple): Soft / non-tender     Hold (Positioning): Assistance needed to correctly position infant at breast and maintain  latch. Intervention(s): Breastfeeding basics reviewed;Support Pillows;Position options;Skin to skin  LATCH Score: 8  (with LC brief assistance and observation)  Lactation Tools Discussed/Used   STS, hand expression, cue feedings  Consult Status Consult Status: Follow-up Date: 11/04/13 Follow-up type: In-patient    Zara Chess 11/03/2013, 9:58 PM

## 2013-11-03 NOTE — Anesthesia Preprocedure Evaluation (Addendum)
Anesthesia Evaluation  Patient identified by MRN, date of birth, ID band Patient awake    Reviewed: Allergy & Precautions, H&P , NPO status , Patient's Chart, lab work & pertinent test results  Airway Mallampati: II TM Distance: >3 FB Neck ROM: Full    Dental no notable dental hx. (+) Teeth Intact   Pulmonary neg pulmonary ROS,  breath sounds clear to auscultation  Pulmonary exam normal       Cardiovascular negative cardio ROS  Rhythm:Regular Rate:Normal     Neuro/Psych negative neurological ROS  negative psych ROS   GI/Hepatic negative GI ROS, Neg liver ROS,   Endo/Other  diabetes, GestationalMorbid obesity  Renal/GU negative Renal ROS  negative genitourinary   Musculoskeletal negative musculoskeletal ROS (+)   Abdominal   Peds negative pediatric ROS (+)  Hematology negative hematology ROS (+)   Anesthesia Other Findings   Reproductive/Obstetrics (+) Pregnancy                          Anesthesia Physical Anesthesia Plan  ASA: III  Anesthesia Plan: Spinal   Post-op Pain Management:    Induction:   Airway Management Planned: Natural Airway  Additional Equipment:   Intra-op Plan:   Post-operative Plan:   Informed Consent: I have reviewed the patients History and Physical, chart, labs and discussed the procedure including the risks, benefits and alternatives for the proposed anesthesia with the patient or authorized representative who has indicated his/her understanding and acceptance.   Dental advisory given  Plan Discussed with:   Anesthesia Plan Comments:         Anesthesia Quick Evaluation

## 2013-11-03 NOTE — Anesthesia Procedure Notes (Signed)
Spinal Patient location during procedure: OR Staffing Anesthesiologist: Amadi Frady Performed by: anesthesiologist  Preanesthetic Checklist Completed: patient identified, site marked, surgical consent, pre-op evaluation, timeout performed, IV checked, risks and benefits discussed and monitors and equipment checked Spinal Block Patient position: sitting Prep: ChloraPrep Patient monitoring: heart rate, continuous pulse ox and blood pressure Approach: right paramedian Location: L4-5 Injection technique: single-shot Needle Needle type: Sprotte  Needle gauge: 24 G Needle length: 9 cm Additional Notes Expiration date of kit checked and confirmed. Patient tolerated procedure well, without complications.     

## 2013-11-03 NOTE — Progress Notes (Signed)
S: no complaint  O: BP 124/83 98.3 Lungs clear to A Cor RRR Abd gravid Pelvic deferred  Tracing: baseline 140 (+) accels reactive  IMP: Class A2 GDM Previous LTCS Polyhydramnios Term gestation P) rpt C/S

## 2013-11-03 NOTE — Transfer of Care (Signed)
Immediate Anesthesia Transfer of Care Note  Patient: [name]  Procedure(s) Performed: Procedure(s): CESAREAN SECTION (N/A)  Patient Location: PACU  Anesthesia Type:Spinal  Level of Consciousness: awake and alert   Airway & Oxygen Therapy: Patient Spontanous Breathing  Post-op Assessment: Report given to PACU RN and Post -op Vital signs reviewed and stable  Post vital signs: Reviewed and stable  Complications: No apparent anesthesia complications

## 2013-11-03 NOTE — Brief Op Note (Signed)
11/02/2013 - 11/03/2013  10:22 AM  PATIENT:  [name]  33 y.o. female  PRE-OPERATIVE DIAGNOSIS:  Previous Cesarean Section, Class A2 GDM, Term gestation, Polyhydramnios, Attempted VBAC, hx umbilical hernia repair with mesh  POST-OPERATIVE DIAGNOSIS:  Previous Cesarean Section, Class A2 GDM, Term gestation, polyhydramnios, Attempted VBAC, hx umbilcal hernia repair with mesh, bowel adhesions  PROCEDURE:  Repeat Cesarean section, Sharl Ma hysterotomy  SURGEON:  Surgeon(s) and Role:    * Ezrah Panning Cathie Beams, MD - Primary  PHYSICIAN ASSISTANT:   ASSISTANTS: Marlinda Mike, CNM   ANESTHESIA:   spinal Findings: live female ROT nl tubes and ovaries, loops of small bowel to right anterior abdominal wall EBL:  Total I/O In: 2300 [I.V.:2300] Out: 850 [Urine:150; Blood:700]  BLOOD ADMINISTERED:none  DRAINS: none   LOCAL MEDICATIONS USED:  MARCAINE     SPECIMEN:  Source of Specimen:  placenta not sent  DISPOSITION OF SPECIMEN:  N/A  COUNTS:  YES  TOURNIQUET:  * No tourniquets in log *  DICTATION: .Other Dictation: Dictation Number [id number]  PLAN OF CARE: Admit to inpatient   PATIENT DISPOSITION:  PACU - hemodynamically stable.   Delay start of Pharmacological VTE agent (>24hrs) due to surgical blood loss or risk of bleeding: no

## 2013-11-04 LAB — CBC
HEMATOCRIT: 32.4 % — AB (ref 36.0–46.0)
Hemoglobin: 11.2 g/dL — ABNORMAL LOW (ref 12.0–15.0)
MCH: 30.8 pg (ref 26.0–34.0)
MCHC: 34.6 g/dL (ref 30.0–36.0)
MCV: 89 fL (ref 78.0–100.0)
Platelets: 214 10*3/uL (ref 150–400)
RBC: 3.64 MIL/uL — ABNORMAL LOW (ref 3.87–5.11)
RDW: 13.9 % (ref 11.5–15.5)
WBC: 16.5 10*3/uL — ABNORMAL HIGH (ref 4.0–10.5)

## 2013-11-04 LAB — GLUCOSE, CAPILLARY
GLUCOSE-CAPILLARY: 106 mg/dL — AB (ref 70–99)
Glucose-Capillary: 111 mg/dL — ABNORMAL HIGH (ref 70–99)
Glucose-Capillary: 128 mg/dL — ABNORMAL HIGH (ref 70–99)
Glucose-Capillary: 132 mg/dL — ABNORMAL HIGH (ref 70–99)

## 2013-11-04 MED ORDER — DOCUSATE SODIUM 100 MG PO CAPS
100.0000 mg | ORAL_CAPSULE | Freq: Every day | ORAL | Status: DC
  Administered 2013-11-04 – 2013-11-05 (×2): 100 mg via ORAL
  Filled 2013-11-04 (×2): qty 1

## 2013-11-04 MED ORDER — MAGNESIUM OXIDE 400 (241.3 MG) MG PO TABS
200.0000 mg | ORAL_TABLET | Freq: Every day | ORAL | Status: DC
  Administered 2013-11-04 – 2013-11-05 (×2): 200 mg via ORAL
  Filled 2013-11-04 (×3): qty 0.5

## 2013-11-04 NOTE — Progress Notes (Signed)
POSTOPERATIVE DAY # 1 S/P CS / noted bowel adhesion at time of surgery   S:         Reports feeling well             Tolerating po intake / no nausea / no vomiting / + flatus / no BM             Bleeding is spotting             Pain controlled with motrin and percocet             Up ad lib / ambulatory/ voiding QS  Newborn bottle feeding  / female  O:  VS: BP 118/72  Pulse 100  Temp(Src) 98.2 F (36.8 C) (Oral)  Resp 20  Ht 5\' 3"  (1.6 m)  Wt 105.688 kg (233 lb)  BMI 41.28 kg/m2  SpO2 98%  Breastfeeding? Unknown   LABS:  CBG: 96-108 associated prior to meal / 128 at HS  / AM fasting 111                                                         (OFF glyburide but ON Carb modified diet)              Recent Labs  11/02/13 0800 11/04/13 0625  WBC 11.7* 16.5*  HGB 12.4 11.2*  PLT 223 214               Bloodtype: --/--/O POS (06/05 0800)  Rubella: Immune (11/07 0000)                                            I&O: net + 3020 ml             Physical Exam:             Alert and Oriented X3  Lungs: Clear and unlabored  Heart: regular rate and rhythm / no mumurs  Abdomen: soft, non-tender, non-distended, hypoactive bowel sounds             Fundus: firm, non-tender, U-1             Dressing intact              Perineum: intact  Lochia: light  Extremities: trace edema, no calf pain or tenderness, negative Homans  A:        POD # 1 S/P CS            Bowel adhesions            GDMa2 - delivered  P:        Routine postoperative care              Continue monitoring CBG             Start magnesium and colace to promote bowel motility - watch closely for ileus   Marlinda Mike CNM, MSN, [facility] 11/04/2013, 9:41 AM

## 2013-11-04 NOTE — Op Note (Signed)
NAMTraci Vasquez:  Vasquez, [name]            ACCOUNT NO.:  [id number]  MEDICAL RECORD NO.:  [id number]  LOCATION:  9135                          FACILITY:  [facility]  PHYSICIAN:  Maxie BetterSheronette Emika Vasquez, M.D.DATE OF BIRTH:  24-Dec-1980  DATE OF PROCEDURE:  11/03/2013 DATE OF DISCHARGE:                              OPERATIVE REPORT   PREOPERATIVE DIAGNOSES: 1. Previous cesarean section. 2. Term gestation. 3. Class A2 gestational diabetes. 4. Polyhydramnios.  PROCEDURES: 1. Repeat cesarean section, Kerr hysterotomy.  POSTOPERATIVE DIAGNOSES: 1. Previous cesarean section. 2. Term gestation. 3. Class A2 gestational diabetes. 4. Bowel adhesions.  ANESTHESIA:  Spinal.  SURGEON:  Maxie BetterSheronette Brenda Marcoe, MD.  ASSISTANT:  [name], CNM.  DESCRIPTION OF PROCEDURE:  Under adequate spinal anesthesia, the patient was placed in the supine position with left lateral tilt.  She was sterilely prepped and draped in usual fashion.  Indwelling Foley catheter was sterilely placed.  A 0.25% Marcaine was injected along the previous Pfannenstiel skin incision site.  Pfannenstiel skin incision was then made, carried down to the rectus fascia.  The rectus fascia was then opened transversely.  The rectus fascia was then bluntly and sharply dissected off the rectus muscle in superior and inferior fashion.  The rectus muscle was split in the midline.  The parietal peritoneum was entered bluntly and carefully extended.  On trying to extend it superiorly, it was noted that the loop of small bowel was attached to the right anterior abdominal wall in several layers.  This was otherwise free from the fundus of the uterus.  Wet pack was then placed into the right quadrant in order to displace the bowel from the operative field.  The vesicouterine peritoneum was then opened transversely.  The bladder was then sharply and bluntly dissected off the lower uterine segment and displaced inferiorly with bladder retractor.  A  curvilinear low transverse uterine incision was then made and extended with bandage scissors.  Subsequent rupture of membranes occurred.  Clear amniotic fluid noted.  Subsequent floating vertex was encountered, resulted in need to use a vacuum for assistance in stabilizing the head and delivering the baby.  The baby was in the right occiput transverse position on initial entry into the uterine cavity. Subsequently, the head was delivered.  Baby was bulb suctioned and then delivered, the rest of the body was delivered.  Cord was then clamped, cut.  The baby was transferred to the awaiting pediatrician who assigned Apgars 8 and 9 at 1 and 5 minutes.  The placenta was manually removed. Uterine cavity was cleaned of debris.  Uterine incision had no extension.  The uterine incision was closed in 2 layers, the first layer with 0 Monocryl running locked stitch, second layer was imbricating 0 Monocryl sutures.  Small bleeding along the vesicouterine peritoneum was cauterized.  There was bleeding way above the bladder.  This was not amenable to cauterization and therefore two 3-0 Vicryl figure-of-eight sutures x2 was placed with hemostasis subsequently achieved.  The abdomen was then copiously irrigated and suctioned of debris.  Normal tubes and ovaries were noted bilaterally.  The bowel pack was removed and the area of bowel to the anterior wall was inspected, it was focal but too involved to  be taken down at this time.  The patient remained asymptomatic as well and there was no indication to bring the general surgeon in.  At that point, Interceed was placed in the lower uterine segment.  The parietal peritoneum was not closed due to the closeness to the area where the bowel was attached.  The lower portion of the rectus muscle was carefully approximated with 2-0 Vicryl figure- of-eight sutures and the rectus fascia was then closed with 0 Vicryl x2. The subcutaneous area was irrigated, small  bleeders cauterized, interrupted 2-0 plain sutures placed, and the skin approximated using Ethicon staples.  SPECIMENS:  Placenta not sent to Pathology.  ESTIMATED BLOOD LOSS:  700 mL.  INTRAOPERATIVE FLUIDS:  2300 mL.  URINE OUTPUT:  150 mL, noted to be blood-tinged in the bag after the patient's abdomen was closed, but was clearing in the tubing. This all was noted at the time of checking total amount of urine.   COMPLICATION:  None.  The patient was transferred to the recovery room in stable condition.Baby was placed skin to skin.     Maxie Better, M.D.     [place]/MEDQ  D:  11/03/2013  T:  11/03/2013  Job:  [id number]

## 2013-11-05 ENCOUNTER — Encounter (HOSPITAL_COMMUNITY): Payer: Self-pay | Admitting: Obstetrics and Gynecology

## 2013-11-05 LAB — GLUCOSE, CAPILLARY
Glucose-Capillary: 114 mg/dL — ABNORMAL HIGH (ref 70–99)
Glucose-Capillary: 106 mg/dL — ABNORMAL HIGH (ref 70–99)

## 2013-11-05 MED ORDER — IBUPROFEN 600 MG PO TABS: 600.0000 mg | ORAL_TABLET | Freq: Four times a day (QID) | ORAL | Status: DC

## 2013-11-05 MED ORDER — OXYCODONE-ACETAMINOPHEN 5-325 MG PO TABS: 1.0000 | ORAL_TABLET | ORAL | Status: DC | PRN

## 2013-11-05 MED ORDER — MAGNESIUM OXIDE 400 (241.3 MG) MG PO TABS: 400.0000 mg | ORAL_TABLET | Freq: Every day | ORAL | Status: DC

## 2013-11-05 MED ORDER — BISACODYL 10 MG RE SUPP
10.0000 mg | Freq: Once | RECTAL | Status: AC
  Administered 2013-11-05: 10 mg via RECTAL
  Filled 2013-11-05: qty 1

## 2013-11-05 NOTE — Lactation Note (Deleted)
This note was copied from the chart of Brenda Trynitee Fore. Lactation Consultation Note  Provided mother with volume guidelines and a foley cup for supplementing with breastmilk. Reviewed foley cup usage. Reminded parents to monitor voids/stools and look at chart in Baby & Me booklet. Encouraged parents to call with further questions.   Patient Name: [name] Date: 11/05/2013     Maternal Data    Feeding Feeding Type: Bottle Fed - Formula Nipple Type: Regular  LATCH Score/Interventions                      Lactation Tools Discussed/Used     Consult Status      [name] 11/05/2013, 10:14 AM

## 2013-11-05 NOTE — Lactation Note (Signed)
This note was copied from the chart of [name]. Lactation Consultation Note  Mother has not starting pumping yet but plans to as soon as she gets home. Mother had DEBP at home.  Reviewed supply, demand, milk supply stimulation, engorgement care.  Provided mother with hand pump and lanolin for lubrication of pump flanges. Encouraged mother to pump q3 and monitor voids/stools of infant, chart in baby and me booklet. Suggest she call if she has further questions.   Patient Name: [name] [id number] Date: 11/05/2013     Maternal Data    Feeding Feeding Type: Bottle Fed - Formula Nipple Type: Regular  LATCH Score/Interventions                      Lactation Tools Discussed/Used     Consult Status      [name] 11/05/2013, 10:18 AM

## 2013-11-05 NOTE — Discharge Instructions (Signed)
Breast Pumping Tips °Pumping your breast milk is a good way to stimulate milk production and have a steady supply of breast milk for your infant. Pumping is most helpful during your infant's growth spurts, when involving dad or a family member, or when you are away. There are several types of pumps available. They can be purchased at a baby or maternity store. You can begin pumping soon after delivery, but some experts believe that you should wait about four weeks to give your infant a bottle. °In general, the more you breastfeed or pump, the more milk you will have for your infant. It is also important to take good care of yourself. This will reduce stress and help your body to create a healthy supply of milk. Your caregiver or lactation consultant can give you the information and support you need in your efforts to breastfeed your infant. °PUMPING BREAST MILK  °Follow the tips below for successful breast pumping. °Take care of yourself. °· Drink enough water or fluids to keep urine clear or pale yellow. You may notice a thirsty feeling while breastfeeding. This is because your body needs more water to make breast milk. Keep a large water bottle handy. Make healthy drink choices such as unsweetened fruit juice, milk and water. Limit soda, coffee, and alcohol (wait 2 hours to feed or pump if you have an alcoholic drink.) °· Eat a healthy, well-balanced diet rich in fruits, vegetables, and whole grains. °· Exercise as recommended by your caregiver. °· Get plenty of sleep. Sleep when your infant sleeps. Ask friends and family for help if you need time to nap or rest. °· Do not smoke. Smoking can lower your milk supply and harm your infant. If you need help quitting, ask your caregiver for a program recommendation. °· Ask your caregiver about birth control options. Birth control pills may lower your milk supply. You may be advised to use condoms or other forms of birth control. °Relax and pump °Stimulating your  let-down reflex is the key to successful and effective pumping. This makes the milk in all parts of the breast flow more freely.  °· It is easier to pump breast milk (and breastfeed) while you are relaxed. Find techniques that work for you. Quiet private spaces, breast massage, soothing heat placed on the breast, music, and pictures or a tape recording of your infant may help you to relax and "let down" your milk. If you have difficulty with your let down, try smelling one of your infant's blankets or an item of clothing he or she has worn while you are pumping. °· When pumping, place the special suction cup (flange) directly over the nipple. It may be uncomfortable and cause nipple damage if it is not placed properly or is the wrong size. Applying a small amount of purified or modified lanolin to your nipple and the areola may help increase your comfort level. Also, you can change the speed and suction of many electric pumps to your comfort level. Your caregiver or lactation consultant can help you with this. °· If pumping continues to be painful, or you feel you are not getting very much milk when you pump, you may need a different type of pump. A lactation consultant can help you determine if this is the case. °· If you are with your infant, feed him or her on demand and try pumping after each feeding. This will boost your production, even if milk does not come out. You may not be able   to pump much milk at first, but keep up the routine, and this will change. °· If you are working or away from your infant for several hours, try pumping for about 15 minutes every 2 to 3 hours. Pump both breasts at the same time if you can. °· If your infant has a formula feeding, make sure you pump your milk around the same time to maintain your supply. °· Begin pumping breast milk a few weeks before you return to work. This will help you develop techniques that work for you and will be able to store extra milk. °· Find a source  of breastfeeding information that works well for you. °TIPS FOR STORING BREAST MILK °· Store breast milk in a sealable sterile bag, jar, or container provided with your pumping supplies. °· Store milk in small amounts close to what your infant is drinking at each feeding. °· Cool pumped milk in a refrigerator or cooler. Pumped milk can last at the back of the refrigerator for 3 to 8 days. °· Place cooled milk at the back of the freezer for up to 3 months. °· Thaw the milk in its container or bag in warm water up to 24 hours in advance. Do not use a microwave to thaw or heat milk. Do not refreeze the milk after it has been thawed. °· Breast milk is safe to drink when left at room temperature (mid 70s or colder) for 4 to 8 hours. After that, throw it away. °· Milk fat can separate and look funny. The color can vary slightly from day to day. This is normal. Always shake the milk before using it to mix the fat with the more watery portion. °SEEK MEDICAL CARE IF:  °· You are having trouble pumping or feeding your infant. °· You are concerned that you are not making enough milk. °· You have nipple pain, soreness, or redness. °· You have other questions or concerns related to you or your infant. °Document Released: 11/04/2009 Document Revised: 08/09/2011 Document Reviewed: 11/04/2009 °[employer]® Patient Information ©2014 [employer]. °Postpartum Depression and Baby Blues °The postpartum period begins right after the birth of a baby. During this time, there is often a great amount of joy and excitement. It is also a time of considerable changes in the life of the parent(s). Regardless of how many times a mother gives birth, each child brings new challenges and dynamics to the family. It is not unusual to have feelings of excitement accompanied by confusing shifts in moods, emotions, and thoughts. All mothers are at risk of developing postpartum depression or the "baby blues." These mood changes can occur right after giving  birth, or they may occur many months after giving birth. The baby blues or postpartum depression can be mild or severe. Additionally, postpartum depression can resolve rather quickly, or it can be a long-term condition. °CAUSES °Elevated hormones and their rapid decline are thought to be a main cause of postpartum depression and the baby blues. There are a number of hormones that radically change during and after pregnancy. Estrogen and progesterone usually decrease immediately after delivering your baby. The level of thyroid hormone and various cortisol steroids also rapidly drop. Other factors that play a major role in these changes include major life events and genetics.  °RISK FACTORS °If you have any of the following risks for the baby blues or postpartum depression, know what symptoms to watch out for during the postpartum period. Risk factors that may increase the likelihood of   getting the baby blues or postpartum depression include:  Havinga personal or family history of depression.  Having depression while being pregnant.  Having premenstrual or oral contraceptive-associated mood issues.  Having exceptional life stress.  Having marital conflict.  Lacking a social support network.  Having a baby with special needs.  Having health problems such as diabetes. SYMPTOMS Baby blues symptoms include:  Brief fluctuations in mood, such as going from extreme happiness to sadness.  Decreased concentration.  Difficulty sleeping.  Crying spells, tearfulness.  Irritability.  Anxiety. Postpartum depression symptoms typically begin within the first month after giving birth. These symptoms include:  Difficulty sleeping or excessive sleepiness.  Marked weight loss.  Agitation.  Feelings of worthlessness.  Lack of interest in activity or food. Postpartum psychosis is a very concerning condition and can be dangerous. Fortunately, it is rare. Displaying any of the following symptoms is  cause for immediate medical attention. Postpartum psychosis symptoms include:  Hallucinations and delusions.  Bizarre or disorganized behavior.  Confusion or disorientation. DIAGNOSIS  A diagnosis is made by an evaluation of your symptoms. There are no medical or lab tests that lead to a diagnosis, but there are various questionnaires that a caregiver may use to identify those with the baby blues, postpartum depression, or psychosis. Often times, a screening tool called the [place] Post[place] Depression Scale is used to diagnose depression in the postpartum period.  TREATMENT The baby blues usually goes away on its own in 1 to 2 weeks. Social support is often all that is needed. You should be encouraged to get adequate sleep and rest. Occasionally, you may be given medicines to help you sleep.  Postpartum depression requires treatment as it can last several months or longer if it is not treated. Treatment may include individual or group therapy, medicine, or both to address any social, physiological, and psychological factors that may play a role in the depression. Regular exercise, a healthy diet, rest, and social support may also be strongly recommended.  Postpartum psychosis is more serious and needs treatment right away. Hospitalization is often needed. HOME CARE INSTRUCTIONS  Get as much rest as you can. Nap when the baby sleeps.  Exercise regularly. Some women find yoga and walking to be beneficial.  Eat a balanced and nourishing diet.  Do little things that you enjoy. Have a cup of tea, take a bubble bath, read your favorite magazine, or listen to your favorite music.  Avoid alcohol.  Ask for help with household chores, cooking, grocery shopping, or running errands as needed. Do not try to do everything.  Talk to people close to you about how you are feeling. Get support from your partner, family members, friends, or other new moms.  Try to stay positive in how you think. Think  about the things you are grateful for.  Do not spend a lot of time alone.  Only take medicine as directed by your caregiver.  Keep all your postpartum appointments.  Let your caregiver know if you have any concerns. SEEK MEDICAL CARE IF: You are having a reaction or problems with your medicine. SEEK IMMEDIATE MEDICAL CARE IF:  You have suicidal feelings.  You feel you may harm the baby or someone else. Document Released: 02/19/2004 Document Revised: 08/09/2011 Document Reviewed: 02/26/2013 [facility] Patient Information 2014 [place] [place]. Mastitis  Mastitis is inflammation of the breast tissue. It occurs most often in women who are breastfeeding, but it can also affect other women, and even sometimes men. CAUSES  Mastitis  is usually caused by a bacterial infection. Bacteria enter the breast tissue through cuts or openings in the skin. Typically, this occurs with breastfeeding because of cracked or irritated skin. Sometimes, it can occur even when there is no opening in the skin. It can be associated with plugged milk (lactiferous) ducts. Nipple piercing can also lead to mastitis. Also, some forms of breast cancer can cause mastitis. SIGNS AND SYMPTOMS   Swelling, redness, tenderness, and pain in an area of the breast.  Swelling of the glands under the arm on the same side.  Fever. If an infection is allowed to progress, a collection of pus (abscess) may develop. DIAGNOSIS  Your health care provider can usually diagnose mastitis based on your symptoms and a physical exam. Tests may be done to help confirm the diagnosis. These may include:   Removal of pus from the breast by applying pressure to the area. This pus can be examined in the lab to determine which bacteria are present. If an abscess has developed, the fluid in the abscess can be removed with a needle. This can also be used to confirm the diagnosis and determine the bacteria present. In most cases, pus will not be  present.  Blood tests to determine if your body is fighting a bacterial infection.  Mammogram or ultrasound tests to rule out other problems or diseases. TREATMENT  Antibiotic medicine is used to treat a bacterial infection. Your health care provider will determine which bacteria are most likely causing the infection and will select an appropriate antibiotic. This is sometimes changed based on the results of tests performed to identify the bacteria, or if there is no response to the antibiotic selected. Antibiotics are usually given by mouth. You may also be given medicine for pain. Mastitis that occurs with breastfeeding will sometimes go away on its own, so your health care provider may choose to wait 24 hours after first seeing you to decide whether a prescription medicine is needed. HOME CARE INSTRUCTIONS   Only take over-the-counter or prescription medicines for pain, fever, or discomfort as directed by your health care provider.  If your health care provider prescribed an antibiotic, take the medicine as directed. Make sure you finish it even if you start to feel better.  Do not wear a tight or underwire bra. Wear a soft, supportive bra.  Increase your fluid intake, especially if you have a fever.  Women who are breastfeeding should follow these instructions:  Continue to empty the breast. Your health care provider can tell you whether this milk is safe for your infant or needs to be thrown out. You may be told to stop nursing until your health care provider thinks it is safe for your baby. Use a breast pump if you are advised to stop nursing.  Keep your nipples clean and dry.  Empty the first breast completely before going to the other breast. If your baby is not emptying your breasts completely for some reason, use a breast pump to empty your breasts.  If you go back to work, pump your breasts while at work to stay in time with your nursing schedule.  Avoid allowing your breasts  to become overly filled with milk (engorged). SEEK MEDICAL CARE IF:   You have pus-like discharge from the breast.  Your symptoms do not improve with the treatment prescribed by your health care provider within 2 days. SEEK IMMEDIATE MEDICAL CARE IF:   Your pain and swelling are getting worse.  You have  pain that is not controlled with medicine.  You have a red line extending from the breast toward your armpit.  You have a fever or persistent symptoms for more than 2 3 days.  You have a fever and your symptoms suddenly get worse. Document Released: 05/17/2005 Document Revised: 03/07/2013 Document Reviewed: 12/15/2012 [facility] Patient Information 2014 [place], [place].

## 2013-11-05 NOTE — Progress Notes (Signed)
POSTOPERATIVE DAY # 2 S/P CS / noted bowel adhesion at time of surgery   S:         Reports feeling well             Tolerating po intake / no nausea / no vomiting / + flatus / no BM             Bleeding is spotting             Pain controlled with motrin and percocet             Up ad lib / ambulatory/ voiding QS  Newborn bottle feeding / female  O:  VS: BP 137/90  Pulse 103  Temp(Src) 97.7 F (36.5 C) (Oral)  Resp 20  Ht 5\' 3"  (1.6 m)  Wt 105.688 kg (233 lb)  BMI 41.28 kg/m2  SpO2 98%  Breastfeeding? Unknown   LABS:  CBG: 96-108 associated prior to meal / 128 at HS  / AM fasting 114                                                         (OFF glyburide but ON Carb modified diet)               Recent Labs  11/04/13 0625  WBC 16.5*  HGB 11.2*  PLT 214               Bloodtype: --/--/O POS (06/05 0800)  Rubella: Immune (11/07 0000)                                            I&O: net + 3020 ml             Physical Exam:             Alert and Oriented X3  Lungs: Clear and unlabored  Heart: regular rate and rhythm / no mumurs  Abdomen: soft, non-tender, non-distended, normal bowel sounds             Fundus: firm, non-tender, U-1             Dressing intact              Perineum: intact  Lochia: light  Extremities: trace edema, no calf pain or tenderness, negative Homans  A:        POD # 2 S/P CS            Bowel adhesions            GDM A2 - delivered  P:        Routine postoperative care              Continue monitoring CBG             Continue magnesium and colace for bowel motility - watch closely for ileus  Desires early discharge home today   Kenard Gower MSN, CNM 11/05/2013, 8:32 AM

## 2013-11-05 NOTE — Discharge Summary (Signed)
POSTOPERATIVE DISCHARGE SUMMARY:  Patient ID: [name] MRN: [id number] DOB/AGE: Oct 29, 1980 33 y.o.  Admit date: 11/02/2013 Admission Diagnoses: Previous Cesarean Section, Class A2 GDM, Term gestation, polyhydramnios, TOLAC, hx umbilcal hernia repair with mesh   Discharge date:  11/05/2013 Discharge Diagnoses: Previous Cesarean Section, Class A2 GDM, Term gestation, polyhydramnios, Attempted VBAC, hx umbilcal hernia repair with mesh, bowel adhesions  Prenatal history: [id number]   EDC : 11/09/2013, ultrasound  [facility]  Primary provider : Dr. Cherly Hensenousins Prenatal course complicated by Previous cesarean delivery, GDM-A2, polyhydramnios  Prenatal Labs: ABO, Rh: O POS (06/05 0800)  Antibody: NEG (06/05 0800) Rubella: Immune (11/07 0000) RPR: NON REAC (06/05 0800)  HBsAg: Negative (11/07 0000)  HIV: Non-reactive (11/07 0000)  GTT : abnormal 1 hr and 3 hr GTT GBS: Positive (11/07 0000)   Medical / Surgical History :  Past medical history:  Past Medical History  Diagnosis Date  . Gestational diabetes     Past surgical history:  Past Surgical History  Procedure Laterality Date  . Cesarean section    . Breast surgery    . Hernia repair    . Bowel obstruction      Family History:  Family History  Problem Relation Age of Onset  . Asthma Mother   . Diabetes Mother   . Hypertension Mother   . Diabetes Father   . Hypertension Father   . Asthma Son   . Arthritis Maternal Grandmother   . COPD Maternal Grandmother   . Heart disease Maternal Grandmother     Social History:  reports that she has never smoked. She does not have any smokeless tobacco history on file. She reports that she does not drink alcohol or use illicit drugs.  Allergies: Keflex and Raspberry   Current Medications at time of admission:  Prior to Admission medications   Medication Sig Start Date End Date Taking? Authorizing Provider  EVENING PRIMROSE OIL PO Take 1  capsule by mouth at bedtime.   Yes Historical Provider, MD  glyBURIDE (DIABETA) 5 MG tablet Take 7.5 mg by mouth daily after supper.   Yes Historical Provider, MD  Prenatal Vit-Fe Fumarate-FA (PRENATAL MULTIVITAMIN) TABS tablet Take 1 tablet by mouth daily at 12 noon.   Yes Historical Provider, MD  ibuprofen (ADVIL,MOTRIN) 600 MG tablet Take 1 tablet (600 mg total) by mouth every 6 (six) hours. 11/05/13   Rolitta Merlene PullingM Dawson, CNM  magnesium oxide (MAG-OX) 400 (241.3 MG) MG tablet Take 1 tablet (400 mg total) by mouth daily. 11/05/13   Rolitta Merlene PullingM Dawson, CNM  oxyCODONE-acetaminophen (PERCOCET/ROXICET) 5-325 MG per tablet Take 1-2 tablets by mouth every 4 (four) hours as needed for severe pain (moderate - severe pain). 11/05/13   Rolitta Merlene PullingM Dawson, CNM    Intrapartum Course:  Pt was admitted for IOL 2nd to Class A2 GDM with desires for VBAC. Due to unfavorable cervix, intracervical balloon and pitocin was started. Pt advanced to 3 cm with balloon in place and declined to go further and requested repeat C/S. Pt was followed postoperatively for poss ileus due to anterior wall bowel adhesions. BS was still monitored due to gestational diabetes. Postoperative course otherwise unremarkable    Procedures: Cesarean section delivery(LTCS)( on 11/03/2013 with delivery of  Viable female newborn by Dr Cherly Hensenousins   See operative report for further details APGAR (1 MIN): 8   APGAR (5 MINS): 8    Postoperative / postpartum course:  Uncomplicated with discharge on POD 2 Complicated  by: continued elevated blood glucose levels  Discharge Instructions:  Discharged Condition: stable  Activity: pelvic rest and postoperative restrictions x 6 weeks  Diet: carb-modified  Medications:    Medication List    STOP taking these medications       EVENING PRIMROSE OIL PO     glyBURIDE 5 MG tablet  Commonly known as:  DIABETA      TAKE these medications       ibuprofen 600 MG tablet  Commonly known as:  ADVIL,MOTRIN   Take 1 tablet (600 mg total) by mouth every 6 (six) hours.     magnesium oxide 400 (241.3 MG) MG tablet  Commonly known as:  MAG-OX  Take 1 tablet (400 mg total) by mouth daily.     oxyCODONE-acetaminophen 5-325 MG per tablet  Commonly known as:  PERCOCET/ROXICET  Take 1-2 tablets by mouth every 4 (four) hours as needed for severe pain (moderate - severe pain).     prenatal multivitamin Tabs tablet  Take 1 tablet by mouth daily at 12 noon.        Wound Care: keep clean and dry / schedule appointment for staple removal on Friday 11/09/2013 Postpartum Instructions: [facility] discharge booklet - instructions reviewed  Discharge to: Home  Follow up :  [facility] in 1 week for interval visit with RN for staple removal [facility] in 6 weeks for routine postpartum visit and diabetic screening with Dr. Cherly Hensen               2hr GTT 8 weeks postpartum Signed: Kenard Gower MSN, CNM  11/05/2013, 8:59 AM

## 2014-04-01 ENCOUNTER — Encounter (HOSPITAL_COMMUNITY): Payer: Self-pay | Admitting: Obstetrics and Gynecology

## 2015-10-13 ENCOUNTER — Emergency Department (HOSPITAL_COMMUNITY)
Admission: EM | Admit: 2015-10-13 | Discharge: 2015-10-13 | Disposition: A | Discharge status: Home / Self Care | Payer: Managed Care, Other (non HMO) | Attending: Emergency Medicine | Admitting: Emergency Medicine

## 2015-10-13 ENCOUNTER — Encounter (HOSPITAL_COMMUNITY): Payer: Self-pay | Admitting: Neurology

## 2015-10-13 ENCOUNTER — Emergency Department (HOSPITAL_COMMUNITY): Payer: Managed Care, Other (non HMO)

## 2015-10-13 DIAGNOSIS — M545 Low back pain: Secondary | ICD-10-CM | POA: Diagnosis present

## 2015-10-13 DIAGNOSIS — Z880 Allergy status to penicillin: Secondary | ICD-10-CM | POA: Diagnosis not present

## 2015-10-13 DIAGNOSIS — Z79899 Other long term (current) drug therapy: Secondary | ICD-10-CM | POA: Insufficient documentation

## 2015-10-13 DIAGNOSIS — Z8632 Personal history of gestational diabetes: Secondary | ICD-10-CM | POA: Insufficient documentation

## 2015-10-13 DIAGNOSIS — Z3202 Encounter for pregnancy test, result negative: Secondary | ICD-10-CM | POA: Diagnosis not present

## 2015-10-13 LAB — URINALYSIS, ROUTINE W REFLEX MICROSCOPIC
BILIRUBIN URINE: NEGATIVE
Glucose, UA: NEGATIVE mg/dL
HGB URINE DIPSTICK: NEGATIVE
Ketones, ur: NEGATIVE mg/dL
Leukocytes, UA: NEGATIVE
Nitrite: NEGATIVE
PH: 6 (ref 5.0–8.0)
Protein, ur: NEGATIVE mg/dL
Specific Gravity, Urine: 1.022 (ref 1.005–1.030)

## 2015-10-13 LAB — POC URINE PREG, ED: Preg Test, Ur: NEGATIVE

## 2015-10-13 MED ORDER — DIAZEPAM 5 MG PO TABS
5.0000 mg | ORAL_TABLET | Freq: Once | ORAL | Status: AC
  Administered 2015-10-13: 5 mg via ORAL
  Filled 2015-10-13: qty 1

## 2015-10-13 MED ORDER — NAPROXEN 500 MG PO TABS: 500.0000 mg | ORAL_TABLET | Freq: Two times a day (BID) | ORAL | Status: DC

## 2015-10-13 MED ORDER — DIAZEPAM 5 MG PO TABS: 5.0000 mg | ORAL_TABLET | Freq: Two times a day (BID) | ORAL | Status: DC

## 2015-10-13 MED ORDER — NAPROXEN 250 MG PO TABS
500.0000 mg | ORAL_TABLET | Freq: Once | ORAL | Status: AC
  Administered 2015-10-13: 500 mg via ORAL
  Filled 2015-10-13: qty 2

## 2015-10-13 NOTE — ED Notes (Addendum)
Pt reports back spasms since last night after washing her daughters hair in the sink. Lower back spasms, has hx of same but unsure of cause. Denies urinary sx.

## 2015-10-13 NOTE — Discharge Instructions (Signed)
Ms. Brenda [name] G [name],  Nice meeting you! Please follow-up with your primary care provider. Return to the emergency department if you develop fevers, chills, loss of bowel or bladder control, inability to walk, increasing pain, numbness/tingling in your legs. Feel better soon!  S. Lane HackerNicole Torben Soloway, PA-C Back Pain, Adult Back pain is very common in adults.The cause of back pain is rarely dangerous and the pain often gets better over time.The cause of your back pain may not be known. Some common causes of back pain include:  Strain of the muscles or ligaments supporting the spine.  Wear and tear (degeneration) of the spinal disks.  Arthritis.  Direct injury to the back. For many people, back pain may return. Since back pain is rarely dangerous, most people can learn to manage this condition on their own. HOME CARE INSTRUCTIONS Watch your back pain for any changes. The following actions may help to lessen any discomfort you are feeling:  Remain active. It is stressful on your back to sit or stand in one place for long periods of time. Do not sit, drive, or stand in one place for more than 30 minutes at a time. Take short walks on even surfaces as soon as you are able.Try to increase the length of time you walk each day.  Exercise regularly as directed by your health care provider. Exercise helps your back heal faster. It also helps avoid future injury by keeping your muscles strong and flexible.  Do not stay in bed.Resting more than 1-2 days can delay your recovery.  Pay attention to your body when you bend and lift. The most comfortable positions are those that put less stress on your recovering back. Always use proper lifting techniques, including:  Bending your knees.  Keeping the load close to your body.  Avoiding twisting.  Find a comfortable position to sleep. Use a firm mattress and lie on your side with your knees slightly bent. If you lie on your back, put a pillow under your  knees.  Avoid feeling anxious or stressed.Stress increases muscle tension and can worsen back pain.It is important to recognize when you are anxious or stressed and learn ways to manage it, such as with exercise.  Take medicines only as directed by your health care provider. Over-the-counter medicines to reduce pain and inflammation are often the most helpful.Your health care provider may prescribe muscle relaxant drugs.These medicines help dull your pain so you can more quickly return to your normal activities and healthy exercise.  Apply ice to the injured area:  Put ice in a plastic bag.  Place a towel between your skin and the bag.  Leave the ice on for 20 minutes, 2-3 times a day for the first 2-3 days. After that, ice and heat may be alternated to reduce pain and spasms.  Maintain a healthy weight. Excess weight puts extra stress on your back and makes it difficult to maintain good posture. SEEK MEDICAL CARE IF:  You have pain that is not relieved with rest or medicine.  You have increasing pain going down into the legs or buttocks.  You have pain that does not improve in one week.  You have night pain.  You lose weight.  You have a fever or chills. SEEK IMMEDIATE MEDICAL CARE IF:   You develop new bowel or bladder control problems.  You have unusual weakness or numbness in your arms or legs.  You develop nausea or vomiting.  You develop abdominal pain.  You feel faint.  This information is not intended to replace advice given to you by your health care provider. Make sure you discuss any questions you have with your health care provider.   Document Released: 05/17/2005 Document Revised: 06/07/2014 Document Reviewed: 09/18/2013 Elsevier Interactive Patient Education [employer].

## 2015-10-17 NOTE — ED Provider Notes (Signed)
CSN: [id number]     Arrival date & time 10/13/15  0709 History   First MD Initiated Contact with Patient 10/13/15 [phone]     Chief Complaint  Patient presents with  . Back Pain   HPI [name] is a 35 y.o. female with no significant PMH presenting with a 1 day history of back pain. She states her pain began when she was washing her daughter's hair in the sink. She describes her pain as a spasm, BL lower back in location, non-radiating, constant, 4/10 pain scale. No fevers, chills, CP, SOB, abdominal pain, N/V, changes in bowel/blader habits.   Past Medical History  Diagnosis Date  . Gestational diabetes    Past Surgical History  Procedure Laterality Date  . Cesarean section    . Breast surgery    . Hernia repair    . Bowel obstruction    . Cesarean section N/A 11/03/2013    Procedure: CESAREAN SECTION;  Surgeon: Serita Kyle, MD;  Location: [facility];  Service: Obstetrics;  Laterality: N/A;   Family History  Problem Relation Age of Onset  . Asthma Mother   . Diabetes Mother   . Hypertension Mother   . Diabetes Father   . Hypertension Father   . Asthma Son   . Arthritis Maternal Grandmother   . COPD Maternal Grandmother   . Heart disease Maternal Grandmother    Social History  Substance Use Topics  . Smoking status: Never Smoker   . Smokeless tobacco: None  . Alcohol Use: No   OB History    Gravida Para Term Preterm AB TAB SAB Ectopic Multiple Living   Review of Systems  Ten systems are reviewed and are negative for acute change except as noted in the HPI   Allergies  Amoxicillin; Keflex; and Raspberry  Home Medications   Prior to Admission medications   Medication Sig Start Date End Date Taking? Authorizing Provider  diazepam (VALIUM) 5 MG tablet Take 1 tablet (5 mg total) by mouth 2 (two) times daily. 10/13/15   Melton Krebs, PA-C  ibuprofen (ADVIL,MOTRIN) 600 MG tablet Take 1 tablet (600 mg total) by mouth every 6 (six)  hours. 11/05/13   Raelyn Mora, CNM  magnesium oxide (MAG-OX) 400 (241.3 MG) MG tablet Take 1 tablet (400 mg total) by mouth daily. 11/05/13   Raelyn Mora, CNM  naproxen (NAPROSYN) 500 MG tablet Take 1 tablet (500 mg total) by mouth 2 (two) times daily. 10/13/15   Melton Krebs, PA-C  oxyCODONE-acetaminophen (PERCOCET/ROXICET) 5-325 MG per tablet Take 1-2 tablets by mouth every 4 (four) hours as needed for severe pain (moderate - severe pain). 11/05/13   Raelyn Mora, CNM  Prenatal Vit-Fe Fumarate-FA (PRENATAL MULTIVITAMIN) TABS tablet Take 1 tablet by mouth daily at 12 noon.    Historical Provider, MD   BP 141/98 mmHg  Pulse 75  Temp(Src) 98.2 F (36.8 C) (Oral)  Resp 18  SpO2 99%  LMP 09/13/2015 Physical Exam  Constitutional: She appears well-developed and well-nourished. No distress.  HENT:  Head: Normocephalic and atraumatic.  Mouth/Throat: Oropharynx is clear and moist. No oropharyngeal exudate.  Eyes: Conjunctivae are normal. Pupils are equal, round, and reactive to light. Right eye exhibits no discharge. Left eye exhibits no discharge. No scleral icterus.  Neck: No tracheal deviation present.  Cardiovascular: Normal rate, regular rhythm, normal heart sounds and intact distal pulses.  Exam reveals no gallop  and no friction rub.   No murmur heard. Pulmonary/Chest: Effort normal and breath sounds normal. No respiratory distress. She has no wheezes. She has no rales. She exhibits no tenderness.  Abdominal: Soft. Bowel sounds are normal. She exhibits no distension and no mass. There is no tenderness. There is no rebound and no guarding.  Musculoskeletal: She exhibits no edema.       Arms: Lymphadenopathy:    She has no cervical adenopathy.  Neurological: She is alert. Coordination normal.  Skin: Skin is warm and dry. No rash noted. She is not diaphoretic. No erythema.  Psychiatric: She has a normal mood and affect. Her behavior is normal.  Nursing note and vitals  reviewed.   ED Course  Procedures  Labs Review Labs Reviewed  URINALYSIS, ROUTINE W REFLEX MICROSCOPIC (NOT [facility])  POC URINE PREG, ED    Imaging Review Dg Pelvis 1-2 Views  10/13/2015  CLINICAL DATA:  Pain following twisting type injury EXAM: PELVIS - 1-2 VIEW COMPARISON:  None. FINDINGS: There is no evidence of pelvic fracture or dislocation. Joint spaces appear normal. There is an intrauterine device in the mid-pelvis. There is evidence of mashed in the lower abdomen upper pelvic regions. IMPRESSION: Intrauterine device in mid pelvis. No fracture or dislocation. No appreciable arthropathic change. Electronically Signed   By: Bretta BangWilliam  Woodruff III M.D.   On: 10/13/2015 07:56    I have personally reviewed and evaluated these images and lab results as part of my medical decision-making.  MDM   Final diagnoses:  Bilateral low back pain, with sciatica presence unspecified   Patient with back pain.  No neurological deficits and normal neuro exam. Pelvis xray and UA negative.  Patient can walk but states is painful.  No loss of bowel or bladder control.  No concern for cauda equina.  No fever, night sweats, weight loss, h/o cancer, IVDU.  RICE protocol and pain medicine indicated and discussed with patient.    Melton KrebsSamantha Nicole Ryland Tungate, PA-C 10/18/15 [id number]  Margarita Grizzleanielle Ray, MD 10/18/15 1425

## 2017-04-27 ENCOUNTER — Emergency Department (HOSPITAL_COMMUNITY): Payer: 59

## 2017-04-27 ENCOUNTER — Other Ambulatory Visit: Payer: Self-pay

## 2017-04-27 ENCOUNTER — Emergency Department (HOSPITAL_COMMUNITY)
Admission: EM | Admit: 2017-04-27 | Discharge: 2017-04-27 | Disposition: A | Discharge status: Home / Self Care | Payer: 59 | Attending: Emergency Medicine | Admitting: Emergency Medicine

## 2017-04-27 ENCOUNTER — Encounter (HOSPITAL_COMMUNITY): Payer: Self-pay | Admitting: *Deleted

## 2017-04-27 DIAGNOSIS — I10 Essential (primary) hypertension: Secondary | ICD-10-CM | POA: Insufficient documentation

## 2017-04-27 DIAGNOSIS — W108XXA Fall (on) (from) other stairs and steps, initial encounter: Secondary | ICD-10-CM | POA: Diagnosis not present

## 2017-04-27 DIAGNOSIS — W19XXXA Unspecified fall, initial encounter: Secondary | ICD-10-CM

## 2017-04-27 DIAGNOSIS — E119 Type 2 diabetes mellitus without complications: Secondary | ICD-10-CM | POA: Diagnosis not present

## 2017-04-27 DIAGNOSIS — S0990XA Unspecified injury of head, initial encounter: Secondary | ICD-10-CM | POA: Diagnosis present

## 2017-04-27 DIAGNOSIS — S060X0A Concussion without loss of consciousness, initial encounter: Secondary | ICD-10-CM | POA: Insufficient documentation

## 2017-04-27 DIAGNOSIS — Y999 Unspecified external cause status: Secondary | ICD-10-CM | POA: Diagnosis not present

## 2017-04-27 DIAGNOSIS — Z79899 Other long term (current) drug therapy: Secondary | ICD-10-CM | POA: Diagnosis not present

## 2017-04-27 DIAGNOSIS — Y9301 Activity, walking, marching and hiking: Secondary | ICD-10-CM | POA: Diagnosis not present

## 2017-04-27 DIAGNOSIS — Y92009 Unspecified place in unspecified non-institutional (private) residence as the place of occurrence of the external cause: Secondary | ICD-10-CM | POA: Diagnosis not present

## 2017-04-27 HISTORY — DX: Depression, unspecified: F32.A

## 2017-04-27 HISTORY — DX: Type 2 diabetes mellitus without complications: E11.9

## 2017-04-27 HISTORY — DX: Essential (primary) hypertension: I10

## 2017-04-27 HISTORY — DX: Major depressive disorder, single episode, unspecified: F32.9

## 2017-04-27 MED ORDER — KETOROLAC TROMETHAMINE 30 MG/ML IJ SOLN
30.0000 mg | Freq: Once | INTRAMUSCULAR | Status: AC
  Administered 2017-04-27: 30 mg via INTRAVENOUS
  Filled 2017-04-27: qty 1

## 2017-04-27 MED ORDER — DIPHENHYDRAMINE HCL 50 MG/ML IJ SOLN
25.0000 mg | Freq: Once | INTRAMUSCULAR | Status: AC
  Administered 2017-04-27: 25 mg via INTRAVENOUS
  Filled 2017-04-27: qty 1

## 2017-04-27 MED ORDER — PROCHLORPERAZINE EDISYLATE 5 MG/ML IJ SOLN
10.0000 mg | Freq: Once | INTRAMUSCULAR | Status: AC
  Administered 2017-04-27: 10 mg via INTRAVENOUS
  Filled 2017-04-27: qty 2

## 2017-04-27 NOTE — Discharge Instructions (Signed)
We suspect you have a concussion from the fall that you sustained.  We did not find any evidence of bleed or fractures.  Please follow-up with your primary care physician in several days for reassessment.  Please rest.  Please stay hydrated.  If any symptoms change or worsen, please return to the nearest emergency department.

## 2017-04-27 NOTE — ED Triage Notes (Signed)
Pt states she tripped on her pajama pants and fell down 6 steps.  No loc, but it did take her 30 min to get up because she felt stunned.  Since then has eaten and driven herself to work.  Now c/o R occipital and frontal pain, L lat rib pain, L shoulder and L hand pain.  AO x 4.  Pupils equal and reactive.  Denies nausea.

## 2017-04-27 NOTE — ED Provider Notes (Signed)
[facility] Provider Note   CSN: [id number] Arrival date & time: 04/27/17  1253     History   Chief Complaint Chief Complaint  Patient presents with  . Fall    HPI [name] is a 36 y.o. female.  The history is provided by the patient and the spouse.  Fall  This is a new problem. The current episode started yesterday. The problem occurs constantly. The problem has not changed since onset.Associated symptoms include headaches. Pertinent negatives include no chest pain, no abdominal pain and no shortness of breath. Nothing aggravates the symptoms. Nothing relieves the symptoms. She has tried nothing for the symptoms.    Past Medical History:  Diagnosis Date  . Depression   . Diabetes mellitus without complication (HCC)    Gestational diabetes  . Hypertension     Patient Active Problem List   Diagnosis Date Noted  . S/P cesarean section 11/03/2013  . Postpartum care following cesarean delivery (11/03/2013) 11/03/2013  . GDM, class A2 11/02/2013  . Encounter for fetal anatomic survey 09/27/2013  . Abnormal antenatal test 09/27/2013    Past Surgical History:  Procedure Laterality Date  . bowel obstruction    . BREAST SURGERY    . CESAREAN SECTION    . CESAREAN SECTION N/A 11/03/2013   Procedure: CESAREAN SECTION;  Surgeon: Serita KyleSheronette A Cousins, MD;  Location: [facility];  Service: Obstetrics;  Laterality: N/A;  . HERNIA REPAIR      OB History    Gravida Para Term Preterm AB Living   2 2 2     2    SAB TAB Ectopic Multiple Live Births           2       Home Medications    Prior to Admission medications   Medication Sig Start Date End Date Taking? Authorizing Provider  diazepam (VALIUM) 5 MG tablet Take 1 tablet (5 mg total) by mouth 2 (two) times daily. 10/13/15   Melton Krebsiley, Samantha Nicole, PA-C  ibuprofen (ADVIL,MOTRIN) 600 MG tablet Take 1 tablet (600 mg total) by mouth every 6 (six) hours. 11/05/13   Raelyn Moraawson, Rolitta, CNM    magnesium oxide (MAG-OX) 400 (241.3 MG) MG tablet Take 1 tablet (400 mg total) by mouth daily. 11/05/13   Raelyn Moraawson, Rolitta, CNM  naproxen (NAPROSYN) 500 MG tablet Take 1 tablet (500 mg total) by mouth 2 (two) times daily. 10/13/15   Melton Krebsiley, Samantha Nicole, PA-C  oxyCODONE-acetaminophen (PERCOCET/ROXICET) 5-325 MG per tablet Take 1-2 tablets by mouth every 4 (four) hours as needed for severe pain (moderate - severe pain). 11/05/13   Raelyn Moraawson, Rolitta, CNM  Prenatal Vit-Fe Fumarate-FA (PRENATAL MULTIVITAMIN) TABS tablet Take 1 tablet by mouth daily at 12 noon.    [provider]    Family History Family History  Problem Relation Age of Onset  . Asthma Mother   . Diabetes Mother   . Hypertension Mother   . Diabetes Father   . Hypertension Father   . Asthma Son   . Arthritis Maternal Grandmother   . COPD Maternal Grandmother   . Heart disease Maternal Grandmother     Social History Social History   Tobacco Use  . Smoking status: Never Smoker  . Smokeless tobacco: Never Used  Substance Use Topics  . Alcohol use: No  . Drug use: No     Allergies   Amoxicillin; Keflex [cephalexin]; and Raspberry   Review of Systems Review of Systems  Constitutional: Negative for chills,  diaphoresis, fatigue and fever.  HENT: Negative for congestion and rhinorrhea.   Respiratory: Negative for chest tightness, shortness of breath, wheezing and stridor.   Cardiovascular: Negative for chest pain, palpitations and leg swelling.  Gastrointestinal: Negative for abdominal pain, constipation, diarrhea, nausea and vomiting.  Genitourinary: Negative for dysuria, flank pain and frequency.  Musculoskeletal: Negative for back pain, neck pain and neck stiffness.  Skin: Negative for rash and wound.  Neurological: Positive for headaches. Negative for dizziness, seizures, facial asymmetry, weakness, light-headedness and numbness.  Psychiatric/Behavioral: Negative for agitation.  All other systems reviewed  and are negative.    Physical Exam Updated Vital Signs BP (!) 154/102 (BP Location: Right Arm)   Pulse 80   Temp 99.1 F (37.3 C) (Oral)   Resp 14   Ht 5\' 3"  (1.6 m)   Wt 92.1 kg (203 lb)   SpO2 98%   BMI 35.96 kg/m   Physical Exam  Constitutional: She is oriented to person, place, and time. She appears well-developed and well-nourished. No distress.  HENT:  Head: Normocephalic.  Mouth/Throat: Oropharynx is clear and moist. No oropharyngeal exudate.  Eyes: Conjunctivae and EOM are normal. Pupils are equal, round, and reactive to light.  Neck: Normal range of motion. Neck supple.  Cardiovascular: Regular rhythm and intact distal pulses. Exam reveals no gallop.  No murmur heard. Pulmonary/Chest: Effort normal. No stridor. No respiratory distress. She has no wheezes. She exhibits no tenderness.  Abdominal: Bowel sounds are normal. There is no tenderness.  Musculoskeletal: She exhibits tenderness. She exhibits no edema or deformity.       Left shoulder: She exhibits tenderness and pain.       Arms:      Legs: Lymphadenopathy:    She has no cervical adenopathy.  Neurological: She is alert and oriented to person, place, and time. She is not disoriented. She displays no tremor. No cranial nerve deficit or sensory deficit. She exhibits normal muscle tone. Coordination normal. GCS eye subscore is 4. GCS verbal subscore is 5. GCS motor subscore is 6.  Skin: Skin is warm. Capillary refill takes less than 2 seconds. No rash noted. She is not diaphoretic. No erythema.  Psychiatric: She has a normal mood and affect.  Nursing note and vitals reviewed.    ED Treatments / Results  Labs (all labs ordered are listed, but only abnormal results are displayed) Labs Reviewed - No data to display  EKG  EKG Interpretation None       Radiology Dg Chest 2 View  Result Date: 04/27/2017 CLINICAL DATA:  Fall, shoulder pain EXAM: CHEST  2 VIEW COMPARISON:  03/17/2004 FINDINGS: The heart  size and mediastinal contours are within normal limits. Both lungs are clear. The visualized skeletal structures are unremarkable. IMPRESSION: No active cardiopulmonary disease. Electronically Signed   By: Judie Petit.  Shick M.D.   On: 04/27/2017 15:46   Ct Head Wo Contrast  Result Date: 04/27/2017 CLINICAL DATA:  36 y/o F; fall down stairs with right occipital headache. EXAM: CT HEAD WITHOUT CONTRAST TECHNIQUE: Contiguous axial images were obtained from the base of the skull through the vertex without intravenous contrast. COMPARISON:  None. FINDINGS: Brain: No evidence of acute infarction, hemorrhage, hydrocephalus, extra-axial collection or mass lesion/mass effect. Vascular: No hyperdense vessel or unexpected calcification. Skull: Normal. Negative for fracture or focal lesion. Sinuses/Orbits: No acute finding. Other: None. IMPRESSION: No acute intracranial abnormality or calvarial fracture. Unremarkable CT of the head. Electronically Signed   By: Buzzy Han.D.  On: 04/27/2017 15:57   Dg Shoulder Left  Result Date: 04/27/2017 CLINICAL DATA:  36 year old female with a history of fall left shoulder pain EXAM: LEFT SHOULDER - 2+ VIEW COMPARISON:  None. FINDINGS: No acute fracture identified. Glenohumeral joint appears congruent. No focal soft tissue swelling. Os acromiale. Unremarkable scapular Y-view. IMPRESSION: No acute bony abnormality Electronically Signed   By: Gilmer Mor D.O.   On: 04/27/2017 15:48   Dg Hip Unilat W Or Wo Pelvis 2-3 Views Left  Result Date: 04/27/2017 CLINICAL DATA:  Fall EXAM: DG HIP (WITH OR WITHOUT PELVIS) 2-3V LEFT COMPARISON:  None. FINDINGS: Negative for fracture.  Left hip joint normal. Ventral hernia repair with mesh.  IUD. IMPRESSION: Negative for fracture Electronically Signed   By: Marlan Palau M.D.   On: 04/27/2017 15:47    Procedures Procedures (including critical care time)  Medications Ordered in ED Medications  prochlorperazine (COMPAZINE)  injection 10 mg (10 mg Intravenous Given 04/27/17 1628)  diphenhydrAMINE (BENADRYL) injection 25 mg (25 mg Intravenous Given 04/27/17 1629)  ketorolac (TORADOL) 30 MG/ML injection 30 mg (30 mg Intravenous Given 04/27/17 1629)     Initial Impression / Assessment and Plan / ED Course  I have reviewed the triage vital signs and the nursing notes.  Pertinent labs & imaging results that were available during my care of the patient were reviewed by me and considered in my medical decision making (see chart for details).     [name] is a 36 y.o. female with a past medical history significant for depression hypertension who presents after a fall.  Patient reports that yesterday, she was walking down some stairs when she had a mechanical fall and tripped on a child's toy.  She reports that she hit her head and her shoulder and left hip.  She reports that she did not lose consciousness but was dazed for several minutes.  She reports continued headache since that time.  She denies vision changes, nausea, or vomiting.  She denies any neurologic deficits.  She is reporting moderate to severe pain that is persistent.  She denies history of intracranial injury or concussions.  She denies other symptoms.  On exam, no focal neurologic deficits.  Patient has tenderness in the left shoulder, left lateral chest, and her left hip.  Lungs were clear and abdomen was nontender.  No nNeck or back tenderness.  Due to the continued symptoms after a significant fall, imaging will be obtained.  CT head reassuring with no intracranial hemorrhage or fractures.  X-rays of the chest, shoulder, and hip showed no injuries.  Patient reassured and informed of imaging findings.  Patient given headache cocktail with significant relief in pain.  Patient requested discharge.  Given reassuring workup and exam, patient felt well for discharge home.  I suspect patient had a concussion.  Patient will follow up with her PCP and  understood return precautions.  Patient had no other questions or concerns and was discharged in good condition.     Final Clinical Impressions(s) / ED Diagnoses   Final diagnoses:  Fall, initial encounter  Concussion without loss of consciousness, initial encounter    ED Discharge Orders    None     Clinical Impression: 1. Fall, initial encounter   2. Concussion without loss of consciousness, initial encounter     Disposition: Discharge  Condition: Good  I have discussed the results, Dx and Tx plan with the pt(& family if present). He/she/they expressed understanding and agree(s) with the plan.  Discharge instructions discussed at great length. Strict return precautions discussed and pt &/or family have verbalized understanding of the instructions. No further questions at time of discharge.    This SmartLink is deprecated. Use AVSMEDLIST instead to display the medication list for a patient.  Follow Up: [facility] [address] [place] [id number] [phone] Schedule an appointment as soon as possible for a visit    [facility] [address] [id number] [place] [phone]       Peace Jost, Canary Brimhristopher J, MD 04/27/17 2147

## 2017-09-20 ENCOUNTER — Observation Stay (HOSPITAL_COMMUNITY)
Admission: EM | Admit: 2017-09-20 | Discharge: 2017-09-21 | Disposition: A | Discharge status: Home / Self Care | Payer: 59 | Attending: General Surgery | Admitting: General Surgery

## 2017-09-20 ENCOUNTER — Emergency Department (HOSPITAL_COMMUNITY): Payer: 59 | Admitting: Certified Registered"

## 2017-09-20 ENCOUNTER — Encounter (HOSPITAL_COMMUNITY)
Admission: EM | Disposition: A | Discharge status: Home / Self Care | Payer: Self-pay | Source: Home / Self Care | Attending: Emergency Medicine

## 2017-09-20 ENCOUNTER — Encounter (HOSPITAL_COMMUNITY): Payer: Self-pay | Admitting: Emergency Medicine

## 2017-09-20 ENCOUNTER — Emergency Department (HOSPITAL_COMMUNITY): Payer: 59

## 2017-09-20 DIAGNOSIS — Z880 Allergy status to penicillin: Secondary | ICD-10-CM | POA: Insufficient documentation

## 2017-09-20 DIAGNOSIS — K760 Fatty (change of) liver, not elsewhere classified: Secondary | ICD-10-CM | POA: Diagnosis not present

## 2017-09-20 DIAGNOSIS — K37 Unspecified appendicitis: Secondary | ICD-10-CM | POA: Diagnosis present

## 2017-09-20 DIAGNOSIS — K3580 Unspecified acute appendicitis: Secondary | ICD-10-CM

## 2017-09-20 DIAGNOSIS — Z881 Allergy status to other antibiotic agents status: Secondary | ICD-10-CM | POA: Insufficient documentation

## 2017-09-20 DIAGNOSIS — Z79899 Other long term (current) drug therapy: Secondary | ICD-10-CM | POA: Insufficient documentation

## 2017-09-20 DIAGNOSIS — I10 Essential (primary) hypertension: Secondary | ICD-10-CM | POA: Insufficient documentation

## 2017-09-20 DIAGNOSIS — K353 Acute appendicitis with localized peritonitis, without perforation or gangrene: Principal | ICD-10-CM | POA: Insufficient documentation

## 2017-09-20 DIAGNOSIS — E119 Type 2 diabetes mellitus without complications: Secondary | ICD-10-CM | POA: Insufficient documentation

## 2017-09-20 HISTORY — PX: LAPAROSCOPIC APPENDECTOMY: SHX408

## 2017-09-20 LAB — CBC WITH DIFFERENTIAL/PLATELET
Basophils Absolute: 0 10*3/uL (ref 0.0–0.1)
Basophils Relative: 0 %
Eosinophils Absolute: 0 10*3/uL (ref 0.0–0.7)
Eosinophils Relative: 0 %
HCT: 44.1 % (ref 36.0–46.0)
Hemoglobin: 15.2 g/dL — ABNORMAL HIGH (ref 12.0–15.0)
Lymphocytes Relative: 7 %
Lymphs Abs: 1.7 10*3/uL (ref 0.7–4.0)
MCH: 30.4 pg (ref 26.0–34.0)
MCHC: 34.5 g/dL (ref 30.0–36.0)
MCV: 88.2 fL (ref 78.0–100.0)
Monocytes Absolute: 1.3 10*3/uL — ABNORMAL HIGH (ref 0.1–1.0)
Monocytes Relative: 5 %
Neutro Abs: 21 10*3/uL — ABNORMAL HIGH (ref 1.7–7.7)
Neutrophils Relative %: 88 %
Platelets: 310 10*3/uL (ref 150–400)
RBC: 5 MIL/uL (ref 3.87–5.11)
RDW: 12.8 % (ref 11.5–15.5)
WBC: 24 10*3/uL — ABNORMAL HIGH (ref 4.0–10.5)

## 2017-09-20 LAB — COMPREHENSIVE METABOLIC PANEL
ALBUMIN: 4.2 g/dL (ref 3.5–5.0)
ALK PHOS: 73 U/L (ref 38–126)
ALT: 26 U/L (ref 14–54)
AST: 38 U/L (ref 15–41)
Anion gap: 10 (ref 5–15)
BILIRUBIN TOTAL: 0.9 mg/dL (ref 0.3–1.2)
BUN: 10 mg/dL (ref 6–20)
CO2: 23 mmol/L (ref 22–32)
CREATININE: 0.89 mg/dL (ref 0.44–1.00)
Calcium: 9.6 mg/dL (ref 8.9–10.3)
Chloride: 104 mmol/L (ref 101–111)
GFR calc Af Amer: >60 mL/min (ref >60)
GFR calc non Af Amer: >60 mL/min (ref >60)
GLUCOSE: 117 mg/dL — AB (ref 65–99)
POTASSIUM: 3.8 mmol/L (ref 3.5–5.1)
Sodium: 137 mmol/L (ref 135–145)
TOTAL PROTEIN: 7.7 g/dL (ref 6.5–8.1)

## 2017-09-20 LAB — URINALYSIS, ROUTINE W REFLEX MICROSCOPIC
Bilirubin Urine: NEGATIVE
Glucose, UA: NEGATIVE mg/dL
KETONES UR: NEGATIVE mg/dL
Leukocytes, UA: NEGATIVE
Nitrite: NEGATIVE
PH: 5 (ref 5.0–8.0)
Protein, ur: NEGATIVE mg/dL
Specific Gravity, Urine: 1.024 (ref 1.005–1.030)

## 2017-09-20 LAB — I-STAT BETA HCG BLOOD, ED (MC, WL, AP ONLY): I-stat hCG, quantitative: <5 m[IU]/mL (ref <5)

## 2017-09-20 LAB — LIPASE, BLOOD: Lipase: 27 U/L (ref 11–51)

## 2017-09-20 LAB — CBG MONITORING, ED: Glucose-Capillary: 102 mg/dL — ABNORMAL HIGH (ref 65–99)

## 2017-09-20 LAB — GLUCOSE, CAPILLARY: Glucose-Capillary: 149 mg/dL — ABNORMAL HIGH (ref 65–99)

## 2017-09-20 SURGERY — APPENDECTOMY, LAPAROSCOPIC
Anesthesia: Choice

## 2017-09-20 MED ORDER — DEXAMETHASONE SODIUM PHOSPHATE 10 MG/ML IJ SOLN
INTRAMUSCULAR | Status: AC
  Filled 2017-09-20: qty 1

## 2017-09-20 MED ORDER — IOPAMIDOL (ISOVUE-300) INJECTION 61%
INTRAVENOUS | Status: AC
  Filled 2017-09-20: qty 100

## 2017-09-20 MED ORDER — PROPOFOL 10 MG/ML IV BOLUS
INTRAVENOUS | Status: DC | PRN
  Administered 2017-09-20: 40 mg via INTRAVENOUS
  Administered 2017-09-20: 200 mg via INTRAVENOUS

## 2017-09-20 MED ORDER — SUGAMMADEX SODIUM 200 MG/2ML IV SOLN
INTRAVENOUS | Status: DC | PRN
  Administered 2017-09-20: 200 mg via INTRAVENOUS

## 2017-09-20 MED ORDER — SCOPOLAMINE 1 MG/3DAYS TD PT72
1.0000 | MEDICATED_PATCH | TRANSDERMAL | Status: DC
  Administered 2017-09-20: 1.5 mg via TRANSDERMAL
  Filled 2017-09-20: qty 1

## 2017-09-20 MED ORDER — PROMETHAZINE HCL 25 MG/ML IJ SOLN: 6.2500 mg | INTRAMUSCULAR | Status: DC | PRN

## 2017-09-20 MED ORDER — METRONIDAZOLE IN NACL 5-0.79 MG/ML-% IV SOLN
500.0000 mg | INTRAVENOUS | Status: DC
  Filled 2017-09-20: qty 100

## 2017-09-20 MED ORDER — KETOROLAC TROMETHAMINE 30 MG/ML IJ SOLN
INTRAMUSCULAR | Status: AC
  Filled 2017-09-20: qty 1

## 2017-09-20 MED ORDER — METRONIDAZOLE IN NACL 5-0.79 MG/ML-% IV SOLN
500.0000 mg | Freq: Once | INTRAVENOUS | Status: AC
  Administered 2017-09-20: 500 mg via INTRAVENOUS
  Filled 2017-09-20: qty 100

## 2017-09-20 MED ORDER — PHENYLEPHRINE 40 MCG/ML (10ML) SYRINGE FOR IV PUSH (FOR BLOOD PRESSURE SUPPORT)
PREFILLED_SYRINGE | INTRAVENOUS | Status: AC
  Filled 2017-09-20: qty 10

## 2017-09-20 MED ORDER — ONDANSETRON HCL 4 MG/2ML IJ SOLN
INTRAMUSCULAR | Status: AC
  Administered 2017-09-20: 4 mg
  Filled 2017-09-20: qty 2

## 2017-09-20 MED ORDER — LIDOCAINE 2% (20 MG/ML) 5 ML SYRINGE
INTRAMUSCULAR | Status: AC
  Filled 2017-09-20: qty 5

## 2017-09-20 MED ORDER — MIDAZOLAM HCL 2 MG/2ML IJ SOLN: 0.5000 mg | Freq: Once | INTRAMUSCULAR | Status: DC | PRN

## 2017-09-20 MED ORDER — ROCURONIUM BROMIDE 10 MG/ML (PF) SYRINGE
PREFILLED_SYRINGE | INTRAVENOUS | Status: DC | PRN
  Administered 2017-09-20: 40 mg via INTRAVENOUS
  Administered 2017-09-20 (×2): 10 mg via INTRAVENOUS

## 2017-09-20 MED ORDER — CIPROFLOXACIN IN D5W 400 MG/200ML IV SOLN: 400.0000 mg | Freq: Once | INTRAVENOUS | Status: DC

## 2017-09-20 MED ORDER — SCOPOLAMINE 1 MG/3DAYS TD PT72
MEDICATED_PATCH | TRANSDERMAL | Status: AC
  Filled 2017-09-20: qty 1

## 2017-09-20 MED ORDER — KETOROLAC TROMETHAMINE 30 MG/ML IJ SOLN
INTRAMUSCULAR | Status: DC | PRN
  Administered 2017-09-20: 30 mg via INTRAVENOUS

## 2017-09-20 MED ORDER — SODIUM CHLORIDE 0.9 % IR SOLN
Status: DC | PRN
  Administered 2017-09-20: 1000 mL

## 2017-09-20 MED ORDER — EPHEDRINE 5 MG/ML INJ
INTRAVENOUS | Status: AC
  Filled 2017-09-20: qty 10

## 2017-09-20 MED ORDER — FENTANYL CITRATE (PF) 250 MCG/5ML IJ SOLN
INTRAMUSCULAR | Status: DC | PRN
  Administered 2017-09-20 (×2): 50 ug via INTRAVENOUS
  Administered 2017-09-20: 100 ug via INTRAVENOUS
  Administered 2017-09-20: 50 ug via INTRAVENOUS

## 2017-09-20 MED ORDER — DIPHENHYDRAMINE HCL 50 MG/ML IJ SOLN
INTRAMUSCULAR | Status: AC
  Filled 2017-09-20: qty 1

## 2017-09-20 MED ORDER — FENTANYL CITRATE (PF) 250 MCG/5ML IJ SOLN
INTRAMUSCULAR | Status: AC
  Filled 2017-09-20: qty 5

## 2017-09-20 MED ORDER — 0.9 % SODIUM CHLORIDE (POUR BTL) OPTIME
TOPICAL | Status: DC | PRN
  Administered 2017-09-20: 1000 mL

## 2017-09-20 MED ORDER — ONDANSETRON HCL 4 MG/2ML IJ SOLN
INTRAMUSCULAR | Status: DC | PRN
  Administered 2017-09-20: 4 mg via INTRAVENOUS

## 2017-09-20 MED ORDER — PROPOFOL 10 MG/ML IV BOLUS
INTRAVENOUS | Status: AC
  Filled 2017-09-20: qty 20

## 2017-09-20 MED ORDER — MEPERIDINE HCL 50 MG/ML IJ SOLN: 6.2500 mg | INTRAMUSCULAR | Status: DC | PRN

## 2017-09-20 MED ORDER — LACTATED RINGERS IV SOLN
INTRAVENOUS | Status: DC
  Administered 2017-09-20 (×2): via INTRAVENOUS

## 2017-09-20 MED ORDER — ONDANSETRON HCL 4 MG/2ML IJ SOLN: 4.0000 mg | Freq: Four times a day (QID) | INTRAMUSCULAR | Status: DC | PRN

## 2017-09-20 MED ORDER — SCOPOLAMINE 1 MG/3DAYS TD PT72
MEDICATED_PATCH | TRANSDERMAL | Status: AC
  Administered 2017-09-20: 1.5 mg via TRANSDERMAL
  Filled 2017-09-20: qty 1

## 2017-09-20 MED ORDER — SUGAMMADEX SODIUM 200 MG/2ML IV SOLN
INTRAVENOUS | Status: AC
  Filled 2017-09-20: qty 2

## 2017-09-20 MED ORDER — ROCURONIUM BROMIDE 10 MG/ML (PF) SYRINGE
PREFILLED_SYRINGE | INTRAVENOUS | Status: AC
  Filled 2017-09-20: qty 5

## 2017-09-20 MED ORDER — LIDOCAINE 2% (20 MG/ML) 5 ML SYRINGE
INTRAMUSCULAR | Status: DC | PRN
  Administered 2017-09-20: 30 mg via INTRAVENOUS

## 2017-09-20 MED ORDER — CIPROFLOXACIN IN D5W 400 MG/200ML IV SOLN
400.0000 mg | Freq: Once | INTRAVENOUS | Status: AC
  Administered 2017-09-20: 400 mg via INTRAVENOUS
  Filled 2017-09-20: qty 200

## 2017-09-20 MED ORDER — HYDROMORPHONE HCL 2 MG/ML IJ SOLN: 0.2500 mg | INTRAMUSCULAR | Status: DC | PRN

## 2017-09-20 MED ORDER — DIPHENHYDRAMINE HCL 50 MG/ML IJ SOLN
12.5000 mg | Freq: Once | INTRAMUSCULAR | Status: AC
  Administered 2017-09-20: 12.5 mg via INTRAVENOUS

## 2017-09-20 MED ORDER — ONDANSETRON HCL 4 MG/2ML IJ SOLN
INTRAMUSCULAR | Status: AC
  Filled 2017-09-20: qty 2

## 2017-09-20 MED ORDER — IOPAMIDOL (ISOVUE-300) INJECTION 61%
100.0000 mL | Freq: Once | INTRAVENOUS | Status: AC | PRN
  Administered 2017-09-20: 100 mL via INTRAVENOUS

## 2017-09-20 MED ORDER — BUPIVACAINE-EPINEPHRINE (PF) 0.25% -1:200000 IJ SOLN
INTRAMUSCULAR | Status: AC
  Filled 2017-09-20: qty 30

## 2017-09-20 MED ORDER — DEXAMETHASONE SODIUM PHOSPHATE 10 MG/ML IJ SOLN
INTRAMUSCULAR | Status: DC | PRN
  Administered 2017-09-20: 4 mg via INTRAVENOUS

## 2017-09-20 MED ORDER — BUPIVACAINE-EPINEPHRINE 0.25% -1:200000 IJ SOLN
INTRAMUSCULAR | Status: DC | PRN
  Administered 2017-09-20: 30 mL

## 2017-09-20 MED ORDER — MIDAZOLAM HCL 2 MG/2ML IJ SOLN
INTRAMUSCULAR | Status: AC
  Filled 2017-09-20: qty 2

## 2017-09-20 MED ORDER — MIDAZOLAM HCL 5 MG/5ML IJ SOLN
INTRAMUSCULAR | Status: DC | PRN
  Administered 2017-09-20: 2 mg via INTRAVENOUS

## 2017-09-20 MED ORDER — METRONIDAZOLE IN NACL 5-0.79 MG/ML-% IV SOLN: 500.0000 mg | Freq: Once | INTRAVENOUS | Status: DC

## 2017-09-20 MED ORDER — HYDROMORPHONE HCL 2 MG/ML IJ SOLN
0.5000 mg | Freq: Once | INTRAMUSCULAR | Status: AC
  Administered 2017-09-20: 0.5 mg via INTRAVENOUS
  Filled 2017-09-20: qty 1

## 2017-09-20 MED ORDER — SUCCINYLCHOLINE CHLORIDE 200 MG/10ML IV SOSY
PREFILLED_SYRINGE | INTRAVENOUS | Status: DC | PRN
  Administered 2017-09-20: 100 mg via INTRAVENOUS

## 2017-09-20 SURGICAL SUPPLY — 60 items
ADH SKN CLS APL DERMABOND .7 (GAUZE/BANDAGES/DRESSINGS)
APL SKNCLS STERI-STRIP NONHPOA (GAUZE/BANDAGES/DRESSINGS) ×1
APPLIER CLIP ROT 10 11.4 M/L (STAPLE) ×3
APR CLP MED LRG 11.4X10 (STAPLE) ×1
BAG SPEC RTRVL 10 TROC 200 (ENDOMECHANICALS) ×1
BANDAGE ADH SHEER 1  50/CT (GAUZE/BANDAGES/DRESSINGS) IMPLANT
BENZOIN TINCTURE PRP APPL 2/3 (GAUZE/BANDAGES/DRESSINGS) ×2 IMPLANT
BLADE CLIPPER SURG (BLADE) IMPLANT
BLADE SURG 10 STRL SS (BLADE) ×2 IMPLANT
BNDG ADH 5X3 H2O RPLNT NS (GAUZE/BANDAGES/DRESSINGS) ×4
BNDG COHESIVE 3X5 WHT NS (GAUZE/BANDAGES/DRESSINGS) ×8 IMPLANT
CANISTER SUCT 3000ML PPV (MISCELLANEOUS) ×3 IMPLANT
CHLORAPREP W/TINT 26ML (MISCELLANEOUS) ×3 IMPLANT
CLIP APPLIE ROT 10 11.4 M/L (STAPLE) IMPLANT
CLOSURE WOUND 1/2 X4 (GAUZE/BANDAGES/DRESSINGS) ×1
COVER SURGICAL LIGHT HANDLE (MISCELLANEOUS) ×3 IMPLANT
CUTTER FLEX LINEAR 45M (STAPLE) ×3 IMPLANT
DERMABOND ADVANCED (GAUZE/BANDAGES/DRESSINGS)
DERMABOND ADVANCED .7 DNX12 (GAUZE/BANDAGES/DRESSINGS) IMPLANT
DRSG TEGADERM 4X4.75 (GAUZE/BANDAGES/DRESSINGS) IMPLANT
ELECT REM PT RETURN 9FT ADLT (ELECTROSURGICAL) ×3
ELECTRODE REM PT RTRN 9FT ADLT (ELECTROSURGICAL) ×1 IMPLANT
ENDOLOOP SUT PDS II  0 18 (SUTURE)
ENDOLOOP SUT PDS II 0 18 (SUTURE) IMPLANT
GAUZE SPONGE 2X2 8PLY STRL LF (GAUZE/BANDAGES/DRESSINGS) IMPLANT
GLOVE BIOGEL M STRL SZ7.5 (GLOVE) ×3 IMPLANT
GLOVE BIOGEL PI IND STRL 8 (GLOVE) ×2 IMPLANT
GLOVE BIOGEL PI INDICATOR 8 (GLOVE) ×2
GOWN STRL REUS W/ TWL LRG LVL3 (GOWN DISPOSABLE) ×2 IMPLANT
GOWN STRL REUS W/TWL 2XL LVL3 (GOWN DISPOSABLE) ×3 IMPLANT
GOWN STRL REUS W/TWL LRG LVL3 (GOWN DISPOSABLE) ×3
GRASPER SUT TROCAR 14GX15 (MISCELLANEOUS) ×2 IMPLANT
KIT BASIN OR (CUSTOM PROCEDURE TRAY) ×3 IMPLANT
KIT TURNOVER KIT B (KITS) ×3 IMPLANT
NS IRRIG 1000ML POUR BTL (IV SOLUTION) ×3 IMPLANT
PAD ARMBOARD 7.5X6 YLW CONV (MISCELLANEOUS) ×6 IMPLANT
POUCH RETRIEVAL ECOSAC 10 (ENDOMECHANICALS) IMPLANT
POUCH RETRIEVAL ECOSAC 10MM (ENDOMECHANICALS) ×2
RELOAD 45 VASCULAR/THIN (ENDOMECHANICALS) IMPLANT
RELOAD STAPLE 45 2.5 WHT GRN (ENDOMECHANICALS) IMPLANT
RELOAD STAPLE 45 3.5 BLU ETS (ENDOMECHANICALS) IMPLANT
RELOAD STAPLE TA45 3.5 REG BLU (ENDOMECHANICALS) ×3 IMPLANT
SCISSORS LAP 5X35 DISP (ENDOMECHANICALS) IMPLANT
SET IRRIG TUBING LAPAROSCOPIC (IRRIGATION / IRRIGATOR) ×3 IMPLANT
SHEARS HARMONIC ACE PLUS 36CM (ENDOMECHANICALS) ×3 IMPLANT
SLEEVE ENDOPATH XCEL 5M (ENDOMECHANICALS) ×9 IMPLANT
SPECIMEN JAR SMALL (MISCELLANEOUS) ×3 IMPLANT
SPONGE GAUZE 2X2 STER 10/PKG (GAUZE/BANDAGES/DRESSINGS)
STRIP CLOSURE SKIN 1/2X4 (GAUZE/BANDAGES/DRESSINGS) ×1 IMPLANT
SUT MNCRL AB 4-0 PS2 18 (SUTURE) ×3 IMPLANT
SUT VICRYL 0 UR6 27IN ABS (SUTURE) ×2 IMPLANT
TOWEL OR 17X24 6PK STRL BLUE (TOWEL DISPOSABLE) ×3 IMPLANT
TOWEL OR 17X26 10 PK STRL BLUE (TOWEL DISPOSABLE) ×3 IMPLANT
TRAY FOLEY CATH SILVER 16FR (SET/KITS/TRAYS/PACK) ×3 IMPLANT
TRAY FOLEY W/METER SILVER 16FR (SET/KITS/TRAYS/PACK) ×2 IMPLANT
TRAY LAPAROSCOPIC MC (CUSTOM PROCEDURE TRAY) ×3 IMPLANT
TROCAR XCEL BLUNT TIP 100MML (ENDOMECHANICALS) ×3 IMPLANT
TROCAR XCEL NON-BLD 5MMX100MML (ENDOMECHANICALS) ×1 IMPLANT
TUBING INSUFFLATION (TUBING) ×3 IMPLANT
WATER STERILE IRR 1000ML POUR (IV SOLUTION) ×1 IMPLANT

## 2017-09-20 NOTE — ED Provider Notes (Signed)
Patient placed in Quick Look pathway, seen and evaluated   Chief Complaint: Abdominal pain, nausea, vomiting  HPI:   Patient presents for evaluation of abdominal pain.  Endorses myalgias 2 days ago and yesterday today developed periumbilical abdominal pain which has since migrated to the right lower quadrant.  Endorses nausea and what one episode of nonbloody nonbilious emesis.  Endorses chills but no fever.  Denies diarrhea, constipation, urinary symptoms, vaginal itching, bleeding, or discharge.  She has had multiple abdominal surgeries including 2 C-sections, umbilical hernia repair, and has had SBO before.   ROS: Positive for abdominal pain, nausea, vomiting negative for urinary urgency, frequency, dysuria, hematuria, vaginal itching, bleeding, discharge, constipation, diarrhea  Physical Exam:   Gen: No distress  Neuro: Awake and Alert  Skin: Warm    Focused Exam: Hypoactive bowel sounds in all 4 quadrants.  Well-healed surgical incisions.  Tender to palpation in the periumbilical, suprapubic, right upper quadrant, and right lower quadrant.  Maximally tender in the right lower quadrant.  No CVA tenderness.   Initiation of care has begun. The patient has been counseled on the process, plan, and necessity for staying for the completion/evaluation, and the remainder of the medical screening examination    Bennye AlmFawze, Trudee Chirino A, PA-C 09/20/17 1416    Gwyneth SproutPlunkett, Whitney, MD 09/20/17 2051

## 2017-09-20 NOTE — ED Provider Notes (Signed)
[facility] EMERGENCY DEPARTMENT Provider Note   CSN: [id number] Arrival date & time: 09/20/17  1326     History   Chief Complaint Chief Complaint  Patient presents with  . Abdominal Pain    HPI [name] is a 37 y.o. female.  HPI   37 year old female presents today with complaints of abdominal pain.  Patient reports 2 days of myalgias with periumbilical pain radiating to the right lower quadrant.  Patient notes nausea and one episode of nonbloody vomiting.  Patient denies subjective fevers at home.  She denies any constipation, diarrhea or urinary complaints.  Patient notes past medical history of cesarean section, umbilical hernia repair and small bowel obstruction.  Patient does report a history of hypertension but is not taking medication for this.  She denies any anticoagulant or antiplatelet therapy.   Past Medical History:  Diagnosis Date  . Depression   . Diabetes mellitus without complication (HCC)    Gestational diabetes  . Hypertension     Patient Active Problem List   Diagnosis Date Noted  . S/P cesarean section 11/03/2013  . Postpartum care following cesarean delivery (11/03/2013) 11/03/2013  . GDM, class A2 11/02/2013  . Encounter for fetal anatomic survey 09/27/2013  . Abnormal antenatal test 09/27/2013    Past Surgical History:  Procedure Laterality Date  . bowel obstruction    . BREAST SURGERY    . CESAREAN SECTION    . CESAREAN SECTION N/A 11/03/2013   Procedure: CESAREAN SECTION;  Surgeon: Serita Kyle, MD;  Location: [facility];  Service: Obstetrics;  Laterality: N/A;  . HERNIA REPAIR       OB History    Gravida  2   Para  2   Term  2   Preterm      AB      Living  2     SAB      TAB      Ectopic      Multiple      Live Births  2            Home Medications    Prior to Admission medications   Medication Sig Start Date End Date Taking? Authorizing Provider  diazepam (VALIUM) 5 MG tablet  Take 1 tablet (5 mg total) by mouth 2 (two) times daily. Patient not taking: Reported on 09/20/2017 10/13/15   Melton Krebs, PA-C  ibuprofen (ADVIL,MOTRIN) 600 MG tablet Take 1 tablet (600 mg total) by mouth every 6 (six) hours. Patient not taking: Reported on 09/20/2017 11/05/13   Raelyn Mora, CNM  magnesium oxide (MAG-OX) 400 (241.3 MG) MG tablet Take 1 tablet (400 mg total) by mouth daily. Patient not taking: Reported on 09/20/2017 11/05/13   Raelyn Mora, CNM  naproxen (NAPROSYN) 500 MG tablet Take 1 tablet (500 mg total) by mouth 2 (two) times daily. Patient not taking: Reported on 09/20/2017 10/13/15   Melton Krebs, PA-C  oxyCODONE-acetaminophen (PERCOCET/ROXICET) 5-325 MG per tablet Take 1-2 tablets by mouth every 4 (four) hours as needed for severe pain (moderate - severe pain). Patient not taking: Reported on 09/20/2017 11/05/13   Raelyn Mora, CNM    Family History Family History  Problem Relation Age of Onset  . Asthma Mother   . Diabetes Mother   . Hypertension Mother   . Diabetes Father   . Hypertension Father   . Asthma Son   . Arthritis Maternal Grandmother   . COPD Maternal Grandmother   . Heart  disease Maternal Grandmother     Social History Social History   Tobacco Use  . Smoking status: Never Smoker  . Smokeless tobacco: Never Used  Substance Use Topics  . Alcohol use: No  . Drug use: No     Allergies   Amoxicillin; Keflex [cephalexin]; and Raspberry   Review of Systems Review of Systems  All other systems reviewed and are negative.   Physical Exam Updated Vital Signs BP (!) 165/109 (BP Location: Right Arm)   Pulse 100   Temp 99.6 F (37.6 C) (Oral)   Resp 20   SpO2 98%   Physical Exam  Constitutional: She is oriented to person, place, and time. She appears well-developed and well-nourished.  HENT:  Head: Normocephalic and atraumatic.  Eyes: Pupils are equal, round, and reactive to light. Conjunctivae are normal. Right  eye exhibits no discharge. Left eye exhibits no discharge. No scleral icterus.  Neck: Normal range of motion. No JVD present. No tracheal deviation present.  Pulmonary/Chest: Effort normal. No stridor.  Abdominal: Soft. She exhibits no distension and no mass. There is tenderness. There is guarding. There is no rebound. No hernia.  Tenderness to palpation her right lower quadrant  Neurological: She is alert and oriented to person, place, and time. Coordination normal.  Psychiatric: She has a normal mood and affect. Her behavior is normal. Judgment and thought content normal.  Nursing note and vitals reviewed.    ED Treatments / Results  Labs (all labs ordered are listed, but only abnormal results are displayed) Labs Reviewed  COMPREHENSIVE METABOLIC PANEL - Abnormal; Notable for the following components:      Result Value   Glucose, Bld 117 (*)    All other components within normal limits  URINALYSIS, ROUTINE W REFLEX MICROSCOPIC - Abnormal; Notable for the following components:   Hgb urine dipstick MODERATE (*)    Bacteria, UA RARE (*)    All other components within normal limits  CBC WITH DIFFERENTIAL/PLATELET - Abnormal; Notable for the following components:   WBC 24.0 (*)    Hemoglobin 15.2 (*)    Neutro Abs 21.0 (*)    Monocytes Absolute 1.3 (*)    All other components within normal limits  LIPASE, BLOOD  I-STAT BETA HCG BLOOD, ED ([facility], [facility], [facility] ONLY)    EKG None  Radiology Ct Abdomen Pelvis W Contrast  Result Date: 09/20/2017 CLINICAL DATA:  Right lower quadrant pain for several hours EXAM: CT ABDOMEN AND PELVIS WITH CONTRAST TECHNIQUE: Multidetector CT imaging of the abdomen and pelvis was performed using the standard protocol following bolus administration of intravenous contrast. CONTRAST:  100mL ISOVUE-300 COMPARISON:  12/09/2008 FINDINGS: Lower chest: No acute abnormality. Hepatobiliary: Diffuse fatty infiltration of the liver is noted. Gallbladder is within normal  limits. Pancreas: Unremarkable. No pancreatic ductal dilatation or surrounding inflammatory changes. Spleen: Normal in size without focal abnormality. Adrenals/Urinary Tract: Adrenal glands are unremarkable. Kidneys are normal, without renal calculi, focal lesion, or hydronephrosis. Bladder is unremarkable. Stomach/Bowel: Scattered diverticular change of the colon is noted without evidence of diverticulitis. No obstructive or inflammatory changes of the large or small bowel are seen. The appendix is well visualized and mildly prominent at 11.5 mm with surrounding inflammatory change. No appendicoliths or periappendiceal fluid collection is identified. Vascular/Lymphatic: No significant vascular findings are present. No enlarged abdominal or pelvic lymph nodes. Reproductive: Uterus and bilateral adnexa are unremarkable. IUD is noted in place. Other: No abdominal wall hernia or abnormality. No abdominopelvic ascites. Postsurgical changes consistent with prior  hernia repair are noted. A few persistent fat containing anterior abdominal wall hernias are noted. Musculoskeletal: No acute or significant osseous findings. IMPRESSION: Changes consistent with early appendicitis with periappendiceal inflammatory change. No peri appendiceal fluid collection or appendicolith is noted. Small fat containing anterior abdominal wall hernias. Diverticulosis without diverticulitis. Electronically Signed   By: Alcide Clever M.D.   On: 09/20/2017 15:33    Procedures Procedures (including critical care time)  Medications Ordered in ED Medications  iopamidol (ISOVUE-300) 61 % injection (has no administration in time range)  ciprofloxacin (CIPRO) IVPB 400 mg (400 mg Intravenous New Bag/Given 09/20/17 1718)    And  metroNIDAZOLE (FLAGYL) IVPB 500 mg (has no administration in time range)  diphenhydrAMINE (BENADRYL) injection 12.5 mg (has no administration in time range)  iopamidol (ISOVUE-300) 61 % injection 100 mL (100 mLs  Intravenous Contrast Given 09/20/17 1509)  HYDROmorphone (DILAUDID) injection 0.5 mg (0.5 mg Intravenous Given 09/20/17 1716)     Initial Impression / Assessment and Plan / ED Course  I have reviewed the triage vital signs and the nursing notes.  Pertinent labs & imaging results that were available during my care of the patient were reviewed by me and considered in my medical decision making (see chart for details).     Final Clinical Impressions(s) / ED Diagnoses   Final diagnoses:  Acute appendicitis, unspecified acute appendicitis type    Labs: I-STAT beta-hCG, CBC, lipase, CMP, UA  Imaging: Ct abd pelvis W contrast   Consults: General surgery  Therapeutics: Cipro, metronidazole, Dilaudid  Discharge Meds:   Assessment/Plan: 37 year old female presents today with what appears to be uncomplicated appendicitis.  She has white count, slightly tachycardic no fever here.  Patient is allergic to penicillin noting facial swelling with amoxicillin.  Patient will be placed on metronidazole, general surgery will be consulted for ongoing evaluation and management.      ED Discharge Orders    None       Rosalio Loud 09/20/17 1740    Little, Ambrose Finland, MD 09/27/17 [phone]

## 2017-09-20 NOTE — Anesthesia Postprocedure Evaluation (Signed)
Anesthesia Post Note  Patient: [name]  Procedure(s) Performed: APPENDECTOMY LAPAROSCOPIC (N/A )     Patient location during evaluation: PACU Anesthesia Type: General Level of consciousness: awake and alert, oriented and patient uncooperative Pain management: pain level controlled Vital Signs Assessment: post-procedure vital signs reviewed and stable Respiratory status: spontaneous breathing, nonlabored ventilation and respiratory function stable Cardiovascular status: blood pressure returned to baseline and stable Postop Assessment: no apparent nausea or vomiting Anesthetic complications: no    Last Vitals:  Vitals:   09/20/17 2130 09/20/17 2145  BP: 137/90 128/84  Pulse: (!) 109 (!) 102  Resp: 18 17  Temp:    SpO2: 97% 97%    Last Pain:  Vitals:   09/20/17 2106  TempSrc:   PainSc: Asleep                 Waylen Depaolo,E. Darely Becknell

## 2017-09-20 NOTE — Op Note (Signed)
NAMEALURA, [name] NO.:  [id number]  MEDICAL RECORD NO.:  [id number]  LOCATION:                                 FACILITY:  PHYSICIAN:  Mary Sella. Andrey Campanile, MD, FACSDATE OF BIRTH:  Aug 19, 1980  DATE OF PROCEDURE:  09/20/2017 DATE OF DISCHARGE:                              OPERATIVE REPORT   PREOPERATIVE DIAGNOSIS:  Acute appendicitis.  POSTOPERATIVE DIAGNOSES: 1. Acute appendicitis. 2. Extensive intraabdominal adhesions.  PROCEDURE:  Laparoscopic appendectomy.  SURGEON:  Mary Sella. Andrey Campanile, MD, FACS.  ASSISTANT:  None.  ANESTHESIA:  General.  ESTIMATED BLOOD LOSS:  Minimal.  SPECIMEN:  Appendix.  FINDINGS:  The patient had had a prior laparoscopic ventral hernia repair in 2010 with a 20 x 25 cm piece of mesh by another surgeon.  She was taken back to the operating room 2 days later for bowel obstruction, which was evaluated and repaired laparoscopic.  At that time, it was noted to have some fluid in the abdomen.  Upon obtaining access with Optiview today in the left upper quadrant, the patient had extensive intraabdominal adhesions in her midline, periumbilical, and upper midline to the mesh and the abdominal wall.  The small bowel in this location was densely adhered to the mesh in these locations.  There was an area in the right lateral abdomen that was free and that is where the majority of the working trocars were placed.  INDICATIONS FOR PROCEDURE:  The patient is a pleasant 37 year old African American female, who developed abdominal pain last night after midnight. It was persisted and did not get better and she had a small amount of emesis, so she came to the emergency room for evaluation.  A CT scan there demonstrated her appendicitis.  There was also evidence of prior attacks from a laparoscopic ventral hernia repair evident on the CT scan.  She had had a prior laparoscopic ventral hernia repair in 2010 and a 20 x 25 cm piece of mesh was placed  at that time.  It was noted that the mesh went from the level of the liver down to the suprapubic location.  She then developed an acute bowel obstruction several days after surgery and was taken back to the operating room for diagnostic laparoscopy.  She had some murky fluid, but Gram stain was negative and the kink was able to be reduced laparoscopically.  A few years later, she underwent a C-section and it was noted that she had some adhesions of small bowel to the anterior abdominal wall.  The peritoneum was not closed on closure for her C-section through her Pfannenstiel incision. I had an extensive conversation with the patient and her husband regarding the typical risk with an appendectomy.  Please see chart for additional details, so we also discussed the possibility of conversion to open, as well as increased risk for enterotomy given her prior abdominal surgery.  DESCRIPTION OF PROCEDURE:  After obtaining informed consent, the patient was taken to the operating room and placed supine on the operating room table.  General endotracheal anesthesia was established.  Sequential compression devices were placed.  A Foley catheter was placed.  Her left arm was tucked with the appropriate padding.  She received Cipro and Flagyl prior to skin incision.  A surgical time-out was performed.  I elected to gain access to her abdomen in the left lateral upper abdominal wall.  There was a prior old trocar site scar in this location.  A small 0.5 cm incision was made, then using a 0 degree 5-mm laparoscope through a 5-mm trocar, I advanced it through all layers of the abdominal wall and entered the abdominal cavity.  Pneumoperitoneum was smoothly established up to a patient pressure of 15 mmHg.  The laparoscope was advanced and the abdominal cavity was surveilled.  There was extensive small bowel adhesions to the mesh in the midline at both periumbilical and in the supraumbilical region;  however, there was a gap between the umbilical adhesions and the upper midline adhesions around the subxiphoid location.  There were also dense adhesions in the lower midline.  I was able to navigate the camera to the right upper quadrant and right lateral abdomen.  This area was free.  A 5 mm trocar was placed under direct visualization after local had been infiltrated.  I then placed the camera on the right side and inspected my Optiview entry site and this area was free of adhesions and there was no evidence of enterotomy.  I was then, with the camera in the right upper abdomen laterally, able to visualize the right lower quadrant.  It was free from adhesions in the right pericolic gutter.  The appendix was visualized. It was thickened and inflamed and consistent with non perforated appendicitis.  I placed another 5 mm trocar in the right lower quadrant laterally about 2 inches from the anterior superior iliac spine and direct visualization.  The patient was then placed in Trendelenburg and rotated to the patient's left.  I was able to grab the appendix and lifted up.  It was relatively free with minimal adhesions.  I was then able to place a 5-mm trocar in the right mid abdomen in the upper abdomen under direct visualization for another 5 trocar socket have a triangulated working space.  I was able to lift the appendix and take down its lateral attachments to the lateral abdominal wall with Harmonic scalpel.  I was able then to use a [place] and dissect out the base of the appendix and created a window around the base of the appendix.  I then took down the mesoappendix in a sequential fashion using Harmonic scalpel.  The right lateral upper abdominal wall 5 mm trocar was upsized to a 12-mm trocar under direct visualization.  Then using an Ethicon 45 mm stapler with a blue load, I stapled across the base of the appendix, incorporating a small cuff of cecum.  This completely  detached the appendix from the cecum.  There was a little bit of bleeding along the staple line out laterally with a small pulsatile bleeder.  Hemostasis was achieved by placing a 10 mm Ligaclip parallel on the staple line.  There was excellent hemostasis.  I then irrigated and suctioned out this location and reinspected for bleeding and there was none.  An Echo sac was then placed in the abdomen and the appendix placed within it.  The trocar and Echo sac were removed after enlarging the fascial defect with a Tresa Endo.  Trocar was replaced.  I reinspected the abdominal contents and there was no evidence of bowel injury.  The 12 mm trocar was removed and the fascial defect was closed with 2 interrupted 0 Vicryl sutures using  the PMI suture passer with laparoscopic assistance.  Local was infiltrated in and around the fascial closure and along the right lateral abdominal wall as a unilateral tap block.  Remaining trocars were removed and skin incisions were closed with 4-0 Monocryl in a subcuticular fashion followed by the application of benzoin, Steri-Strips, and bandages.  The Foley catheter was removed.  The patient was extubated and taken to recovery room in stable condition.  All needle, instrument, and sponge counts were correct x2.  There were no immediate complications.  The patient tolerated the procedure well.  I did discuss the finding of extensive small bowel adhesions to the midline with the patient's family and advised them that she not have any incision through her midline if at all possible because of the high risk of enterotomy.     Mary SellaEric M. Andrey CampanileWilson, MD, FACS     EMW/MEDQ  D:  09/20/2017  T:  09/20/2017  Job:  [id number]

## 2017-09-20 NOTE — ED Triage Notes (Signed)
Pt to ER for evaluation of RLQ abdominal pain onset yesterday. Reports nausea and vomiting onset this morning. Last BM yesterday. No diarrhea. Pt a/o x4.

## 2017-09-20 NOTE — Brief Op Note (Signed)
09/20/2017  9:04 PM  PATIENT:  Astrid Draftsourtney G Rozo  37 y.o. female  PRE-OPERATIVE DIAGNOSIS:  APPENDICITIS  POST-OPERATIVE DIAGNOSIS:  ACUTE APPENDICITIS; Extensive adhesions to anterior abdominal wall  PROCEDURE:  Procedure(s): APPENDECTOMY LAPAROSCOPIC (N/A)  SURGEON:  Surgeon(s) and Role:    Gaynelle Adu* Jayah Balthazar, MD - Primary  PHYSICIAN ASSISTANT:   ASSISTANTS: none   ANESTHESIA:   general  EBL:  minimal   BLOOD ADMINISTERED:none  DRAINS: none   LOCAL MEDICATIONS USED:  MARCAINE     SPECIMEN:  Source of Specimen:  appendix  DISPOSITION OF SPECIMEN:  PATHOLOGY  COUNTS:  YES  TOURNIQUET:  * No tourniquets in log *  DICTATION: .Other Dictation: Dictation Number [id number]  PLAN OF CARE: Admit for overnight observation  PATIENT DISPOSITION:  PACU - hemodynamically stable.   Delay start of Pharmacological VTE agent (>24hrs) due to surgical blood loss or risk of bleeding: no

## 2017-09-20 NOTE — Anesthesia Preprocedure Evaluation (Addendum)
Anesthesia Evaluation  Patient identified by MRN, date of birth, ID band Patient awake    Reviewed: Allergy & Precautions, NPO status , Patient's Chart, lab work & pertinent test results  History of Anesthesia Complications Negative for: history of anesthetic complications  Airway Mallampati: II  TM Distance: >3 FB Neck ROM: Full    Dental  (+) Edentulous Upper, Dental Advisory Given   Pulmonary neg pulmonary ROS,    breath sounds clear to auscultation       Cardiovascular hypertension (does not take BP meds),  Rhythm:Regular Rate:Normal     Neuro/Psych negative neurological ROS     GI/Hepatic Neg liver ROS, abd pain with acute appy   Endo/Other  neg diabetesMorbid obesityH/o gestational DM  Renal/GU negative Renal ROS     Musculoskeletal   Abdominal (+) + obese,   Peds  Hematology negative hematology ROS (+)   Anesthesia Other Findings   Reproductive/Obstetrics                            Anesthesia Physical Anesthesia Plan  ASA: II and emergent  Anesthesia Plan: General   Post-op Pain Management:    Induction: Intravenous and Rapid sequence  PONV Risk Score and Plan: 4 or greater and Ondansetron, Dexamethasone and Scopolamine patch - Pre-op  Airway Management Planned: Oral ETT  Additional Equipment:   Intra-op Plan:   Post-operative Plan: Extubation in OR  Informed Consent: I have reviewed the patients History and Physical, chart, labs and discussed the procedure including the risks, benefits and alternatives for the proposed anesthesia with the patient or authorized representative who has indicated his/her understanding and acceptance.   Dental advisory given  Plan Discussed with: CRNA and Surgeon  Anesthesia Plan Comments: (Plan routine monitors, GETA)       Anesthesia Quick Evaluation

## 2017-09-20 NOTE — H&P (Signed)
[name] is an 37 y.o. female.   Chief Complaint: abd pain HPI: 37yo obese AAF with HTN developed some generalized abdominal pain last night while sleeping. She couldn't get comfortable.  Took a laxative to see if that would help but didn't. The discomfort worsened throughout the morning and then had a small amount of emesis while brushing her teeth. Because the abd pain was worsening she came to ED. Pain mostly in RLQ now. No prior symptoms. +N/v. Didn't take temperature. BM in ED. 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 Had reoperation 2 days later for bowel obstruction per pt.   Doesn't generally take her BP meds. Wants to control her BP with physical activity - walking.   Past Medical History:  Diagnosis Date  . Depression   . Diabetes mellitus without complication (HCC)    Gestational diabetes  . Hypertension     Past Surgical History:  Procedure Laterality Date  . bowel obstruction    . BREAST SURGERY    . CESAREAN SECTION    . CESAREAN SECTION N/A 11/03/2013   Procedure: CESAREAN SECTION;  Surgeon: Marvene Staff, MD;  Location: [facility];  Service: Obstetrics;  Laterality: N/A;  . HERNIA REPAIR      Family History  Problem Relation Age of Onset  . Asthma Mother   . Diabetes Mother   . Hypertension Mother   . Diabetes Father   . Hypertension Father   . Asthma Son   . Arthritis Maternal Grandmother   . COPD Maternal Grandmother   . Heart disease Maternal Grandmother    Social History:  reports that she has never smoked. She has never used smokeless tobacco. She reports that she does not drink alcohol or use drugs.  Allergies:  Allergies  Allergen Reactions  . Amoxicillin Hives    Has patient had a PCN reaction causing immediate rash, facial/tongue/throat swelling, SOB or lightheadedness with hypotension: No Has patient had a PCN  reaction causing severe rash involving mucus membranes or skin necrosis: No Has patient had a PCN reaction that required hospitalization: No Has patient had a PCN reaction occurring within the last 10 years: Yes If all of the above answers are "NO", then may proceed with Cephalosporin use.  Marland Kitchen Keflex [Cephalexin] Hives  . Raspberry Swelling     (Not in a hospital admission)  Results for orders placed or performed during the hospital encounter of 09/20/17 (from the past 48 hour(s))  Lipase, blood     Status: None   Collection Time: 09/20/17  2:05 PM  Result Value Ref Range   Lipase 27 11 - 51 U/L    Comment: Performed at [facility], [place] [address] [place], [place] [address]  Comprehensive metabolic panel     Status: Abnormal   Collection Time: 09/20/17  2:05 PM  Result Value Ref Range   Sodium 137 135 - 145 mmol/L   Potassium 3.8 3.5 - 5.1 mmol/L   Chloride 104 101 - 111 mmol/L   CO2 23 22 - 32 mmol/L   Glucose, Bld 117 (H) 65 - 99 mg/dL   BUN 10 6 - 20 mg/dL   Creatinine, Ser 0.89 0.44 - 1.00 mg/dL   Calcium 9.6 8.9 - 10.3 mg/dL   Total Protein 7.7 6.5 - 8.1 g/dL   Albumin 4.2 3.5 - 5.0 g/dL   AST 38 15 - 41  U/L   ALT 26 14 - 54 U/L   Alkaline Phosphatase 73 38 - 126 U/L   Total Bilirubin 0.9 0.3 - 1.2 mg/dL   GFR calc non Af Amer >60 >60 mL/min   GFR calc Af Amer >60 >60 mL/min    Comment: (NOTE) The eGFR has been calculated using the CKD EPI equation. This calculation has not been validated in all clinical situations. eGFR's persistently <60 mL/min signify possible Chronic Kidney Disease.    Anion gap 10 5 - 15    Comment: Performed at [facility] [address] [place], [place] [address]  Urinalysis, Routine w reflex microscopic     Status: Abnormal   Collection Time: 09/20/17  2:05 PM  Result Value Ref Range   Color, Urine YELLOW YELLOW   APPearance CLEAR CLEAR   Specific Gravity, Urine 1.024 1.005 - 1.030   pH 5.0 5.0 - 8.0   Glucose, UA  NEGATIVE NEGATIVE mg/dL   Hgb urine dipstick MODERATE (A) NEGATIVE   Bilirubin Urine NEGATIVE NEGATIVE   Ketones, ur NEGATIVE NEGATIVE mg/dL   Protein, ur NEGATIVE NEGATIVE mg/dL   Nitrite NEGATIVE NEGATIVE   Leukocytes, UA NEGATIVE NEGATIVE   RBC / HPF 0-5 0 - 5 RBC/hpf   WBC, UA 0-5 0 - 5 WBC/hpf   Bacteria, UA RARE (A) NONE SEEN   Squamous Epithelial / LPF 0-5 0 - 5    Comment: Please note change in reference range.   Mucus PRESENT     Comment: Performed at [facility], [place] [address], [place], [place] [address]  CBC with Differential     Status: Abnormal   Collection Time: 09/20/17  2:13 PM  Result Value Ref Range   WBC 24.0 (H) 4.0 - 10.5 K/uL   RBC 5.00 3.87 - 5.11 MIL/uL   Hemoglobin 15.2 (H) 12.0 - 15.0 g/dL   HCT 44.1 36.0 - 46.0 %   MCV 88.2 78.0 - 100.0 fL   MCH 30.4 26.0 - 34.0 pg   MCHC 34.5 30.0 - 36.0 g/dL   RDW 12.8 11.5 - 15.5 %   Platelets 310 150 - 400 K/uL   Neutrophils Relative % 88 %   Neutro Abs 21.0 (H) 1.7 - 7.7 K/uL   Lymphocytes Relative 7 %   Lymphs Abs 1.7 0.7 - 4.0 K/uL   Monocytes Relative 5 %   Monocytes Absolute 1.3 (H) 0.1 - 1.0 K/uL   Eosinophils Relative 0 %   Eosinophils Absolute 0.0 0.0 - 0.7 K/uL   Basophils Relative 0 %   Basophils Absolute 0.0 0.0 - 0.1 K/uL    Comment: Performed at [facility] [address], [place], [place] [address]  I-Stat beta hCG blood, ED     Status: None   Collection Time: 09/20/17  2:20 PM  Result Value Ref Range   I-stat hCG, quantitative <5.0 <5 mIU/mL   Comment 3            Comment:   GEST. AGE      CONC.  (mIU/mL)   <=1 WEEK        5 - 50     2 WEEKS       50 - 500     3 WEEKS       100 - 10,000     4 WEEKS     1,000 - 30,000        FEMALE AND NON-PREGNANT FEMALE:     LESS  THAN 5 mIU/mL    Ct Abdomen Pelvis W Contrast  Result Date: 09/20/2017 CLINICAL DATA:  Right lower quadrant pain for several hours EXAM: CT ABDOMEN AND PELVIS WITH CONTRAST TECHNIQUE: Multidetector CT imaging  of the abdomen and pelvis was performed using the standard protocol following bolus administration of intravenous contrast. CONTRAST:  135m ISOVUE-300 COMPARISON:  12/09/2008 FINDINGS: Lower chest: No acute abnormality. Hepatobiliary: Diffuse fatty infiltration of the liver is noted. Gallbladder is within normal limits. Pancreas: Unremarkable. No pancreatic ductal dilatation or surrounding inflammatory changes. Spleen: Normal in size without focal abnormality. Adrenals/Urinary Tract: Adrenal glands are unremarkable. Kidneys are normal, without renal calculi, focal lesion, or hydronephrosis. Bladder is unremarkable. Stomach/Bowel: Scattered diverticular change of the colon is noted without evidence of diverticulitis. No obstructive or inflammatory changes of the large or small bowel are seen. The appendix is well visualized and mildly prominent at 11.5 mm with surrounding inflammatory change. No appendicoliths or periappendiceal fluid collection is identified. Vascular/Lymphatic: No significant vascular findings are present. No enlarged abdominal or pelvic lymph nodes. Reproductive: Uterus and bilateral adnexa are unremarkable. IUD is noted in place. Other: No abdominal wall hernia or abnormality. No abdominopelvic ascites. Postsurgical changes consistent with prior hernia repair are noted. A few persistent fat containing anterior abdominal wall hernias are noted. Musculoskeletal: No acute or significant osseous findings. IMPRESSION: Changes consistent with early appendicitis with periappendiceal inflammatory change. No peri appendiceal fluid collection or appendicolith is noted. Small fat containing anterior abdominal wall hernias. Diverticulosis without diverticulitis. Electronically Signed   By: MInez CatalinaM.D.   On: 09/20/2017 15:33    Review of Systems  Constitutional: Negative for weight loss.  HENT: Negative for nosebleeds.   Eyes: Negative for blurred vision.  Respiratory: Negative for shortness of  breath.   Cardiovascular: Negative for chest pain, palpitations, orthopnea and PND.       Denies DOE  Gastrointestinal: Positive for abdominal pain, nausea and vomiting.  Genitourinary: Negative for dysuria and hematuria.  Musculoskeletal: Negative.   Skin: Negative for itching and rash.  Neurological: Negative for dizziness, focal weakness, seizures, loss of consciousness and headaches.       Denies TIAs, amaurosis fugax  Endo/Heme/Allergies: Does not bruise/bleed easily.  Psychiatric/Behavioral: The patient is not nervous/anxious.     Blood pressure (!) 165/109, pulse 100, temperature 99.6 F (37.6 C), temperature source Oral, resp. rate 20, SpO2 98 %, unknown if currently breastfeeding. Physical Exam  Vitals reviewed. Constitutional: She is oriented to person, place, and time. She appears well-developed and well-nourished. She appears ill. No distress.  A little ill appearing  HENT:  Head: Normocephalic and atraumatic.  Right Ear: External ear normal.  Left Ear: External ear normal.  Eyes: Conjunctivae are normal. No scleral icterus.  Neck: Normal range of motion. Neck supple. No tracheal deviation present. No thyromegaly present.  Cardiovascular: Normal rate and normal heart sounds.  Respiratory: Effort normal and breath sounds normal. No stridor. No respiratory distress. She has no wheezes.  GI: There is tenderness in the right lower quadrant. There is no rigidity and no rebound.  Periumbilical and RLQ TTP; more in RLQ; +voluntary guarding.   Musculoskeletal: She exhibits no edema or tenderness.  Lymphadenopathy:    She has no cervical adenopathy.  Neurological: She is alert and oriented to person, place, and time. She exhibits normal muscle tone.  Skin: Skin is warm and dry. No rash noted. She is not diaphoretic. No erythema. No pallor.  Psychiatric: She has a normal mood and affect.  Her behavior is normal. Judgment and thought content normal.     Assessment/Plan Acute  appendicitis Obesity HTN  We discussed the etiology and management of acute appendicitis. We discussed operative and nonoperative management.  I recommended operative management along with IV antibiotics.  We discussed laparoscopic appendectomy. We discussed the risk and benefits of surgery including but not limited to bleeding, infection, injury to surrounding structures, need to convert to an open procedure, blood clot formation, post operative abscess or wound infection, staple line complications such as leak or bleeding, hernia formation, post operative ileus, need for additional procedures, anesthesia complications, and the typical postoperative course. I explained that the patient should expect a good improvement in their symptoms.  I explained to her and her husband that she was at higher chance for conversion to open as well as injury to surrounding structures given her prior lap ventral hernia repair. We discussed that if we converted to open - higher risk for wound infection, possibility of skin being left open, incisional hernia formation, longer hospital stay  Leighton Ruff. Redmond Pulling, MD, FACS General, Bariatric, & Minimally Invasive Surgery [facility], [place]    Greer Pickerel, MD 09/20/2017, 5:54 PM

## 2017-09-20 NOTE — Transfer of Care (Signed)
Immediate Anesthesia Transfer of Care Note  Patient: Brenda BernheimCourtney G Vasquez  Procedure(s) Performed: APPENDECTOMY LAPAROSCOPIC (N/A )  Patient Location: PACU  Anesthesia Type:General  Level of Consciousness: sedated and patient cooperative  Airway & Oxygen Therapy: Patient Spontanous Breathing and Patient connected to nasal cannula oxygen  Post-op Assessment: Report given to RN, Post -op Vital signs reviewed and stable and Patient moving all extremities  Post vital signs: Reviewed and stable  Last Vitals:  Vitals Value Taken Time  BP 102/67 09/20/2017  9:07 PM  Temp    Pulse 124 09/20/2017  9:08 PM  Resp 43 09/20/2017  9:08 PM  SpO2 97 % 09/20/2017  9:08 PM  Vitals shown include unvalidated device data.  Last Pain:  Vitals:   09/20/17 1404  TempSrc:   PainSc: 4          Complications: No apparent anesthesia complications

## 2017-09-20 NOTE — Anesthesia Procedure Notes (Signed)
Procedure Name: Intubation Date/Time: 09/20/2017 7:19 PM Performed by: Myna Bright, CRNA Pre-anesthesia Checklist: Patient identified, Suction available, Emergency Drugs available and Patient being monitored Patient Re-evaluated:Patient Re-evaluated prior to induction Oxygen Delivery Method: Circle system utilized Preoxygenation: Pre-oxygenation with 100% oxygen Induction Type: IV induction, Rapid sequence and Cricoid Pressure applied Laryngoscope Size: Mac and 3 Grade View: Grade I Tube type: Oral Tube size: 7.0 mm Number of attempts: 1 Airway Equipment and Method: Stylet Placement Confirmation: ETT inserted through vocal cords under direct vision,  positive ETCO2 and breath sounds checked- equal and bilateral Secured at: 21 cm Tube secured with: Tape Dental Injury: Teeth and Oropharynx as per pre-operative assessment

## 2017-09-20 NOTE — ED Notes (Signed)
Report called to OR  

## 2017-09-21 ENCOUNTER — Encounter (HOSPITAL_COMMUNITY): Payer: Self-pay | Admitting: General Surgery

## 2017-09-21 ENCOUNTER — Other Ambulatory Visit: Payer: Self-pay

## 2017-09-21 LAB — BASIC METABOLIC PANEL
Anion gap: 10 (ref 5–15)
BUN: 8 mg/dL (ref 6–20)
CHLORIDE: 104 mmol/L (ref 101–111)
CO2: 22 mmol/L (ref 22–32)
CREATININE: 0.91 mg/dL (ref 0.44–1.00)
Calcium: 9 mg/dL (ref 8.9–10.3)
GFR calc Af Amer: >60 mL/min (ref >60)
GFR calc non Af Amer: >60 mL/min (ref >60)
GLUCOSE: 137 mg/dL — AB (ref 65–99)
Potassium: 3.9 mmol/L (ref 3.5–5.1)
Sodium: 136 mmol/L (ref 135–145)

## 2017-09-21 LAB — CBC
HCT: 39.8 % (ref 36.0–46.0)
Hemoglobin: 13.6 g/dL (ref 12.0–15.0)
MCH: 30.4 pg (ref 26.0–34.0)
MCHC: 34.2 g/dL (ref 30.0–36.0)
MCV: 88.8 fL (ref 78.0–100.0)
PLATELETS: 311 10*3/uL (ref 150–400)
RBC: 4.48 MIL/uL (ref 3.87–5.11)
RDW: 13.2 % (ref 11.5–15.5)
WBC: 18.4 10*3/uL — ABNORMAL HIGH (ref 4.0–10.5)

## 2017-09-21 MED ORDER — MORPHINE SULFATE (PF) 2 MG/ML IV SOLN
1.0000 mg | INTRAVENOUS | Status: DC | PRN
  Administered 2017-09-21: 2 mg via INTRAVENOUS
  Filled 2017-09-21: qty 1

## 2017-09-21 MED ORDER — ONDANSETRON 4 MG PO TBDP: 4.0000 mg | ORAL_TABLET | Freq: Four times a day (QID) | ORAL | Status: DC | PRN

## 2017-09-21 MED ORDER — METRONIDAZOLE IN NACL 5-0.79 MG/ML-% IV SOLN
500.0000 mg | Freq: Three times a day (TID) | INTRAVENOUS | Status: AC
  Administered 2017-09-21: 500 mg via INTRAVENOUS
  Filled 2017-09-21: qty 100

## 2017-09-21 MED ORDER — ACETAMINOPHEN 500 MG PO TABS: 1000.0000 mg | ORAL_TABLET | Freq: Four times a day (QID) | ORAL | 0 refills | Status: DC | PRN

## 2017-09-21 MED ORDER — KCL IN DEXTROSE-NACL 20-5-0.45 MEQ/L-%-% IV SOLN
INTRAVENOUS | Status: DC
  Administered 2017-09-21: 02:00:00 via INTRAVENOUS
  Filled 2017-09-21: qty 1000

## 2017-09-21 MED ORDER — GABAPENTIN 300 MG PO CAPS
300.0000 mg | ORAL_CAPSULE | Freq: Two times a day (BID) | ORAL | Status: DC
  Administered 2017-09-21 (×2): 300 mg via ORAL
  Filled 2017-09-21 (×2): qty 1

## 2017-09-21 MED ORDER — PROMETHAZINE HCL 25 MG/ML IJ SOLN: 12.5000 mg | Freq: Four times a day (QID) | INTRAMUSCULAR | Status: DC | PRN

## 2017-09-21 MED ORDER — KETOROLAC TROMETHAMINE 30 MG/ML IJ SOLN
30.0000 mg | Freq: Three times a day (TID) | INTRAMUSCULAR | Status: DC
  Administered 2017-09-21 (×2): 30 mg via INTRAVENOUS
  Filled 2017-09-21 (×2): qty 1

## 2017-09-21 MED ORDER — HYDRALAZINE HCL 20 MG/ML IJ SOLN: 10.0000 mg | INTRAMUSCULAR | Status: DC | PRN

## 2017-09-21 MED ORDER — FAMOTIDINE IN NACL 20-0.9 MG/50ML-% IV SOLN
20.0000 mg | Freq: Two times a day (BID) | INTRAVENOUS | Status: DC
  Administered 2017-09-21: 20 mg via INTRAVENOUS
  Filled 2017-09-21 (×2): qty 50

## 2017-09-21 MED ORDER — CIPROFLOXACIN IN D5W 400 MG/200ML IV SOLN
400.0000 mg | Freq: Two times a day (BID) | INTRAVENOUS | Status: AC
  Administered 2017-09-21: 400 mg via INTRAVENOUS
  Filled 2017-09-21: qty 200

## 2017-09-21 MED ORDER — SIMETHICONE 80 MG PO CHEW: 40.0000 mg | CHEWABLE_TABLET | Freq: Four times a day (QID) | ORAL | Status: DC | PRN

## 2017-09-21 MED ORDER — ACETAMINOPHEN 500 MG PO TABS
1000.0000 mg | ORAL_TABLET | Freq: Four times a day (QID) | ORAL | Status: DC
  Administered 2017-09-21 (×2): 1000 mg via ORAL
  Filled 2017-09-21 (×2): qty 2

## 2017-09-21 MED ORDER — OXYCODONE HCL 5 MG PO TABS: 5.0000 mg | ORAL_TABLET | ORAL | Status: DC | PRN

## 2017-09-21 MED ORDER — ONDANSETRON HCL 4 MG/2ML IJ SOLN
4.0000 mg | Freq: Four times a day (QID) | INTRAMUSCULAR | Status: DC | PRN
Start: 2017-09-21 — End: 2017-09-21

## 2017-09-21 MED ORDER — DIPHENHYDRAMINE HCL 50 MG/ML IJ SOLN: 12.5000 mg | Freq: Four times a day (QID) | INTRAMUSCULAR | Status: DC | PRN

## 2017-09-21 MED ORDER — ENOXAPARIN SODIUM 40 MG/0.4ML ~~LOC~~ SOLN
40.0000 mg | SUBCUTANEOUS | Status: DC
  Administered 2017-09-21: 40 mg via SUBCUTANEOUS
  Filled 2017-09-21: qty 0.4

## 2017-09-21 MED ORDER — DIPHENHYDRAMINE HCL 12.5 MG/5ML PO ELIX: 12.5000 mg | ORAL_SOLUTION | Freq: Four times a day (QID) | ORAL | Status: DC | PRN

## 2017-09-21 MED ORDER — IBUPROFEN 200 MG PO TABS
400.0000 mg | ORAL_TABLET | Freq: Four times a day (QID) | ORAL | 2 refills | Status: AC | PRN
Start: 2017-09-21 — End: 2018-09-21

## 2017-09-21 NOTE — Progress Notes (Signed)
1 Day Post-Op   Subjective/Chief Complaint: Pain well controlled. Ambulating, passed flatus, tolerating liquids   Objective: Vital signs in last 24 hours: Temp:  [97.3 F (36.3 C)-99.6 F (37.6 C)] 98.6 F (37 C) (04/24 0602) Pulse Rate:  [93-113] 101 (04/24 0602) Resp:  [14-45] 18 (04/24 0602) BP: (102-165)/(67-109) 141/96 (04/24 0602) SpO2:  [96 %-99 %] 96 % (04/24 0602) Weight:  [97.5 kg (215 lb)] 97.5 kg (215 lb) (04/24 0200)    Intake/Output from previous day: 04/23 0701 - 04/24 0700 In: 1857.5 [I.V.:1707.5; IV Piggyback:150] Out: 50 [Blood:50] Intake/Output this shift: No intake/output data recorded.  Head: Normocephalic, without obvious abnormality Resp: clear to auscultation bilaterally GI: soft, appropriately tender around incisions which are c/d/i with steri strips, nondistended Skin: Skin color, texture, turgor normal. No rashes or lesions  Lab Results:  Recent Labs    09/20/17 1413 09/21/17 0621  WBC 24.0* 18.4*  HGB 15.2* 13.6  HCT 44.1 39.8  PLT 310 311   BMET Recent Labs    09/20/17 1405 09/21/17 0621  NA 137 136  K 3.8 3.9  CL 104 104  CO2 23 22  GLUCOSE 117* 137*  BUN 10 8  CREATININE 0.89 0.91  CALCIUM 9.6 9.0   PT/INR No results for input(s): LABPROT, INR in the last 72 hours. ABG No results for input(s): PHART, HCO3 in the last 72 hours.  Invalid input(s): PCO2, PO2  Studies/Results: Ct Abdomen Pelvis W Contrast  Result Date: 09/20/2017 CLINICAL DATA:  Right lower quadrant pain for several hours EXAM: CT ABDOMEN AND PELVIS WITH CONTRAST TECHNIQUE: Multidetector CT imaging of the abdomen and pelvis was performed using the standard protocol following bolus administration of intravenous contrast. CONTRAST:  ISOVUE-300 COMPARISON:  12/09/2008 FINDINGS: Lower chest: No acute abnormality. Hepatobiliary: Diffuse fatty infiltration of the liver is noted. Gallbladder is within normal limits. Pancreas: Unremarkable. No pancreatic ductal  dilatation or surrounding inflammatory changes. Spleen: Normal in size without focal abnormality. Adrenals/Urinary Tract: Adrenal glands are unremarkable. Kidneys are normal, without renal calculi, focal lesion, or hydronephrosis. Bladder is unremarkable. Stomach/Bowel: Scattered diverticular change of the colon is noted without evidence of diverticulitis. No obstructive or inflammatory changes of the large or small bowel are seen. The appendix is well visualized and mildly prominent at 11.5 mm with surrounding inflammatory change. No appendicoliths or periappendiceal fluid collection is identified. Vascular/Lymphatic: No significant vascular findings are present. No enlarged abdominal or pelvic lymph nodes. Reproductive: Uterus and bilateral adnexa are unremarkable. IUD is noted in place. Other: No abdominal wall hernia or abnormality. No abdominopelvic ascites. Postsurgical changes consistent with prior hernia repair are noted. A few persistent fat containing anterior abdominal wall hernias are noted. Musculoskeletal: No acute or significant osseous findings. IMPRESSION: Changes consistent with early appendicitis with periappendiceal inflammatory change. No peri appendiceal fluid collection or appendicolith is noted. Small fat containing anterior abdominal wall hernias. Diverticulosis without diverticulitis. Electronically Signed   By: Alcide Clever M.D.   On: 09/20/2017 15:33    Anti-infectives: Anti-infectives (From admission, onward)   Start     Dose/Rate Route Frequency Ordered Stop   09/21/17 0500  ciprofloxacin (CIPRO) IVPB 400 mg     400 mg 200 mL/hr over 60 Minutes Intravenous Every 12 hours 09/21/17 0033 09/21/17 0622   09/21/17 0400  metroNIDAZOLE (FLAGYL) IVPB 500 mg     500 mg 100 mL/hr over 60 Minutes Intravenous Every 8 hours 09/21/17 0033 09/21/17 0503   09/20/17 1930  metroNIDAZOLE (FLAGYL) IVPB 500 mg  Status:  Discontinued     500 mg 100 mL/hr over 60 Minutes Intravenous To Surgery  09/20/17 1921 09/21/17 0033   09/20/17 1715  ciprofloxacin (CIPRO) IVPB 400 mg     400 mg 200 mL/hr over 60 Minutes Intravenous  Once 09/20/17 1705 09/20/17 1818   09/20/17 1715  metroNIDAZOLE (FLAGYL) IVPB 500 mg     500 mg 100 mL/hr over 60 Minutes Intravenous  Once 09/20/17 1705 09/20/17 1947   09/20/17 1700  ciprofloxacin (CIPRO) IVPB 400 mg  Status:  Discontinued     400 mg 200 mL/hr over 60 Minutes Intravenous  Once 09/20/17 1646 09/20/17 1703   09/20/17 1700  metroNIDAZOLE (FLAGYL) IVPB 500 mg  Status:  Discontinued     500 mg 100 mL/hr over 60 Minutes Intravenous  Once 09/20/17 1646 09/20/17 1703      Assessment/Plan: s/p Procedure(s): APPENDECTOMY LAPAROSCOPIC (N/A) Discharge  LOS: 0 days    Berna BueChelsea A December Hedtke 09/21/2017

## 2017-09-21 NOTE — Discharge Instructions (Signed)

## 2017-09-21 NOTE — Progress Notes (Signed)
Pt given discharge instructions and gone over with her and husband present. Work note from MD also provided to her. Pt in no distress at time of discharge.

## 2017-09-21 NOTE — Discharge Summary (Signed)
[facility] Discharge Summary   Patient ID: [name] MRN: [id number] DOB/AGE: Apr 30, 1981 37 y.o.  Admit date: 09/20/2017 Discharge date: 09/21/2017  Admitting Diagnosis: Acute appendicitis  Discharge Diagnosis s/p laparoscopic appendectomy   Consultants None  Imaging: Ct Abdomen Pelvis W Contrast  Result Date: 09/20/2017 CLINICAL DATA:  Right lower quadrant pain for several hours EXAM: CT ABDOMEN AND PELVIS WITH CONTRAST TECHNIQUE: Multidetector CT imaging of the abdomen and pelvis was performed using the standard protocol following bolus administration of intravenous contrast. CONTRAST:  ISOVUE-300 COMPARISON:  12/09/2008 FINDINGS: Lower chest: No acute abnormality. Hepatobiliary: Diffuse fatty infiltration of the liver is noted. Gallbladder is within normal limits. Pancreas: Unremarkable. No pancreatic ductal dilatation or surrounding inflammatory changes. Spleen: Normal in size without focal abnormality. Adrenals/Urinary Tract: Adrenal glands are unremarkable. Kidneys are normal, without renal calculi, focal lesion, or hydronephrosis. Bladder is unremarkable. Stomach/Bowel: Scattered diverticular change of the colon is noted without evidence of diverticulitis. No obstructive or inflammatory changes of the large or small bowel are seen. The appendix is well visualized and mildly prominent at 11.5 mm with surrounding inflammatory change. No appendicoliths or periappendiceal fluid collection is identified. Vascular/Lymphatic: No significant vascular findings are present. No enlarged abdominal or pelvic lymph nodes. Reproductive: Uterus and bilateral adnexa are unremarkable. IUD is noted in place. Other: No abdominal wall hernia or abnormality. No abdominopelvic ascites. Postsurgical changes consistent with prior hernia repair are noted. A few persistent fat containing anterior abdominal wall hernias are noted. Musculoskeletal: No acute or significant osseous findings.  IMPRESSION: Changes consistent with early appendicitis with periappendiceal inflammatory change. No peri appendiceal fluid collection or appendicolith is noted. Small fat containing anterior abdominal wall hernias. Diverticulosis without diverticulitis. Electronically Signed   By: Alcide Clever M.D.   On: 09/20/2017 15:33    Procedures Dr. Andrey Campanile (09/20/17) - Laparoscopic Appendectomy  Hospital Course:  Patient is a 37 year old female who presented to [facility] with abdominal pain.  Workup showed acute appendicitis.  Patient was admitted and underwent procedure listed above.  Tolerated procedure well and was transferred to the floor.  Diet was advanced as tolerated.  On POD#1, the patient was voiding well, tolerating diet, ambulating well, pain well controlled, vital signs stable, incisions c/d/i and felt stable for discharge home.  Patient will follow up in our office in 2 weeks and knows to call with questions or concerns. She will call to confirm appointment date/time.    Physical Exam: General:  Alert, NAD, pleasant, comfortable Abd:  Soft, ND, mild tenderness, incisions C/D/I  Allergies as of 09/21/2017      Reactions   Amoxicillin Hives   Has patient had a PCN reaction causing immediate rash, facial/tongue/throat swelling, SOB or lightheadedness with hypotension: No Has patient had a PCN reaction causing severe rash involving mucus membranes or skin necrosis: No Has patient had a PCN reaction that required hospitalization: No Has patient had a PCN reaction occurring within the last 10 years: Yes If all of the above answers are "NO", then may proceed with Cephalosporin use.   Keflex [cephalexin] Hives   Raspberry Swelling      Medication List    STOP taking these medications   diazepam 5 MG tablet Commonly known as:  VALIUM   magnesium oxide 400 (241.3 Mg) MG tablet Commonly known as:  MAG-OX   naproxen 500 MG tablet Commonly known as:  NAPROSYN   oxyCODONE-acetaminophen 5-325 MG  tablet Commonly known as:  PERCOCET/ROXICET     TAKE  these medications   acetaminophen 500 MG tablet Commonly known as:  TYLENOL Take 2 tablets (1,000 mg total) by mouth every 6 (six) hours as needed for mild pain.   ibuprofen 200 MG tablet Commonly known as:  MOTRIN IB Take 2 tablets (400 mg total) by mouth every 6 (six) hours as needed for mild pain. What changed:    medication strength  how much to take  when to take this  reasons to take this        Follow-up Information    [facility] Go on 10/11/2017.   Specialty:  General Surgery Why:  Your appointment is at 9:15 AM. Please arrive 30 min prior to appointment time. Bring photo ID and insurance information.  Contact information: [address] [place] [id number] [phone]           Signed: Wells GuilesKelly Rayburn, [facility] 09/21/2017, 9:29 AM Pager: [phone] Consults: [phone] Mon-Fri 7:00 am-4:30 pm Sat-Sun 7:00 am-11:30 am

## 2019-07-11 IMAGING — DX DG HIP (WITH OR WITHOUT PELVIS) 2-3V*L*
3 series · 3 of 3 positions shown · non-contrast
Comparison: None.

CLINICAL DATA: Fall

EXAM:
DG HIP (WITH OR WITHOUT PELVIS) 2-3V LEFT

[pelvis ap]
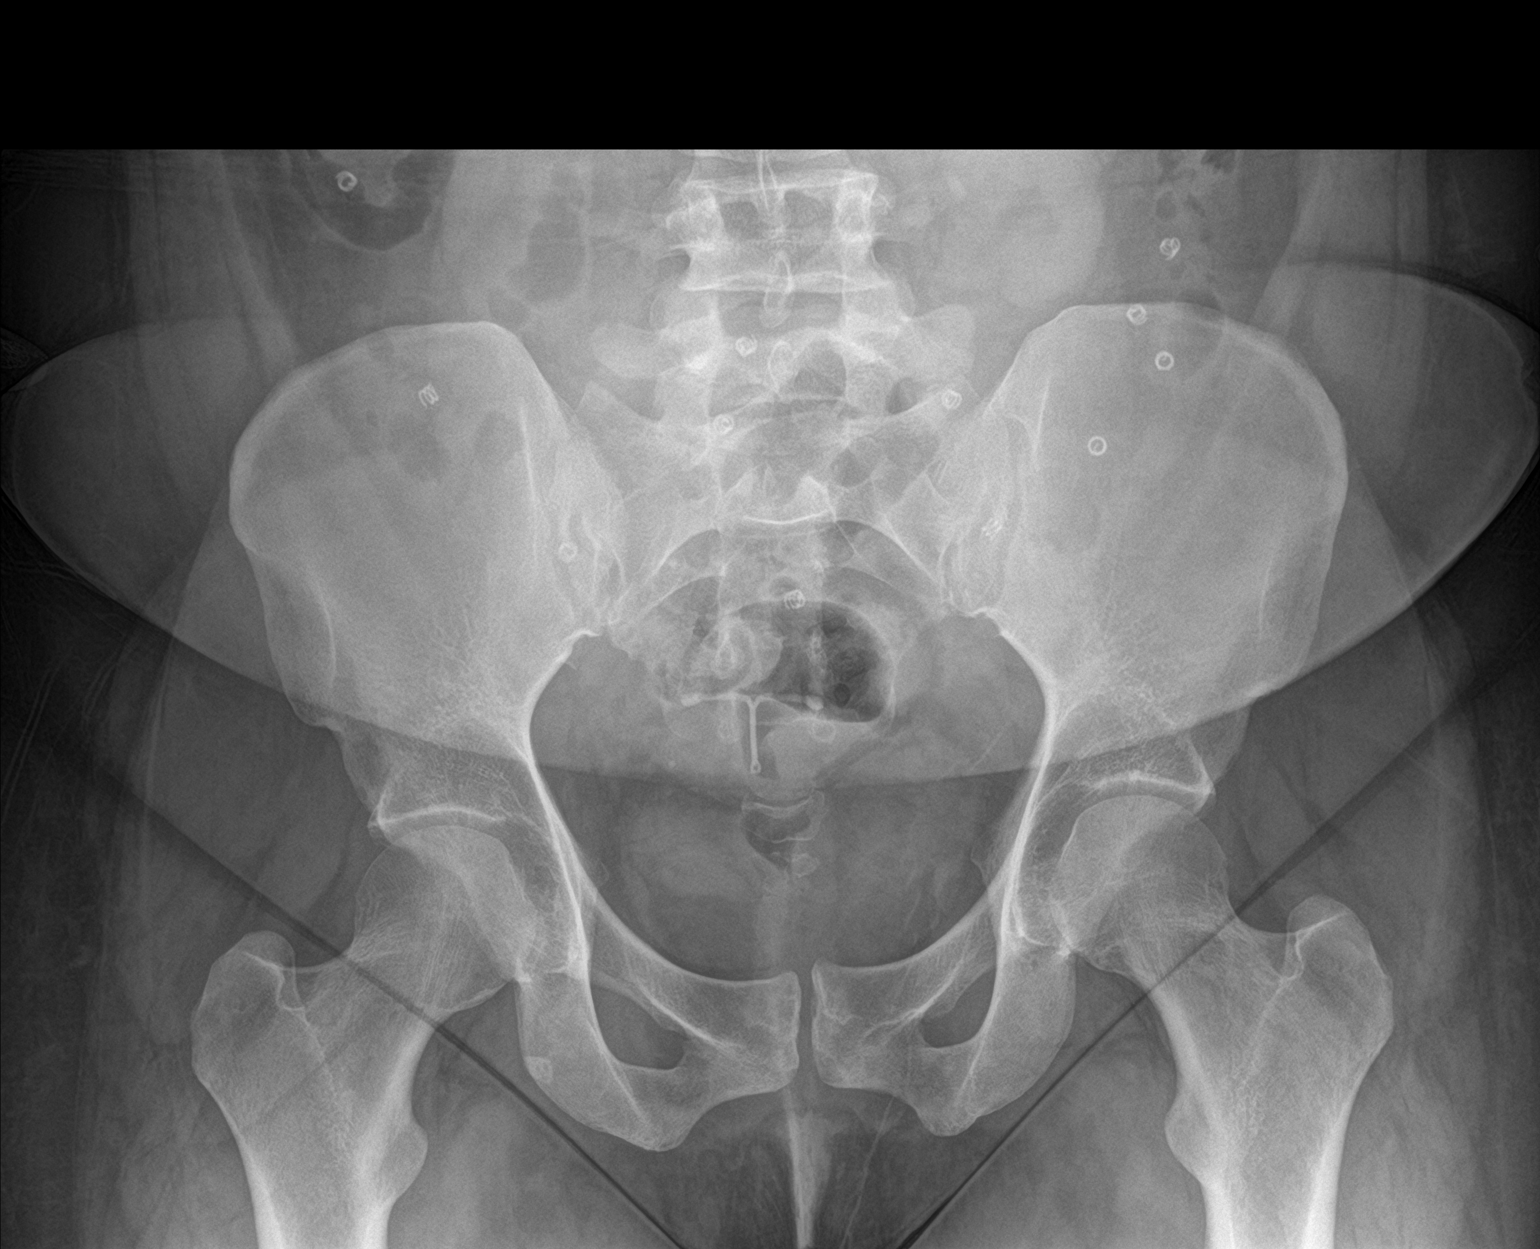

[hip ap]
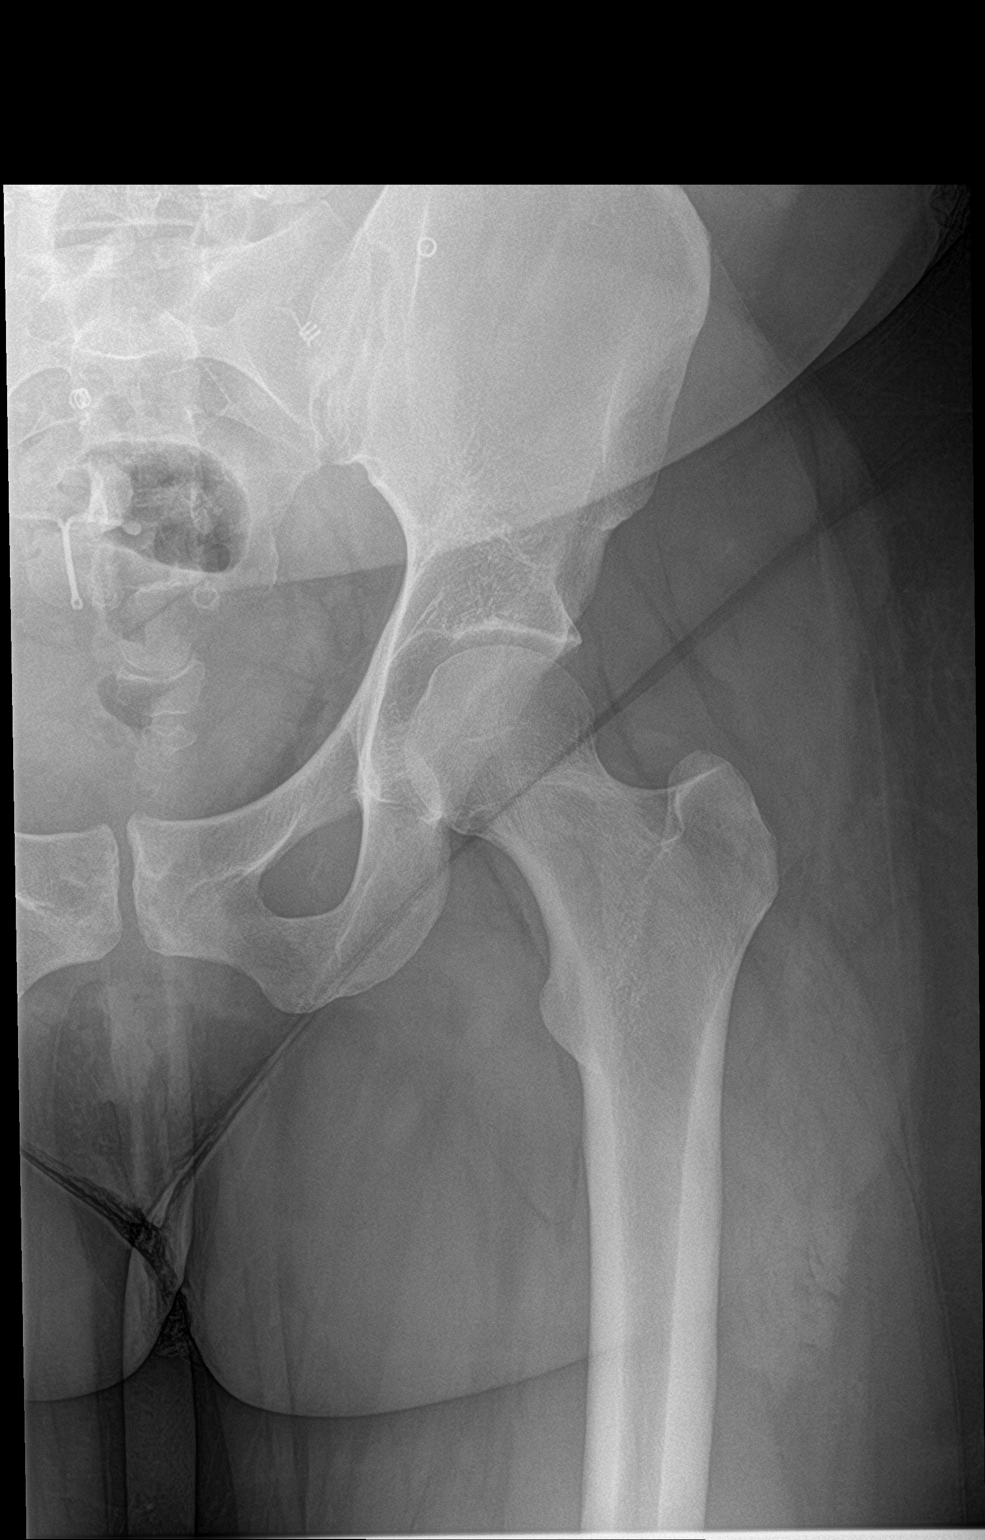

[hip lat]
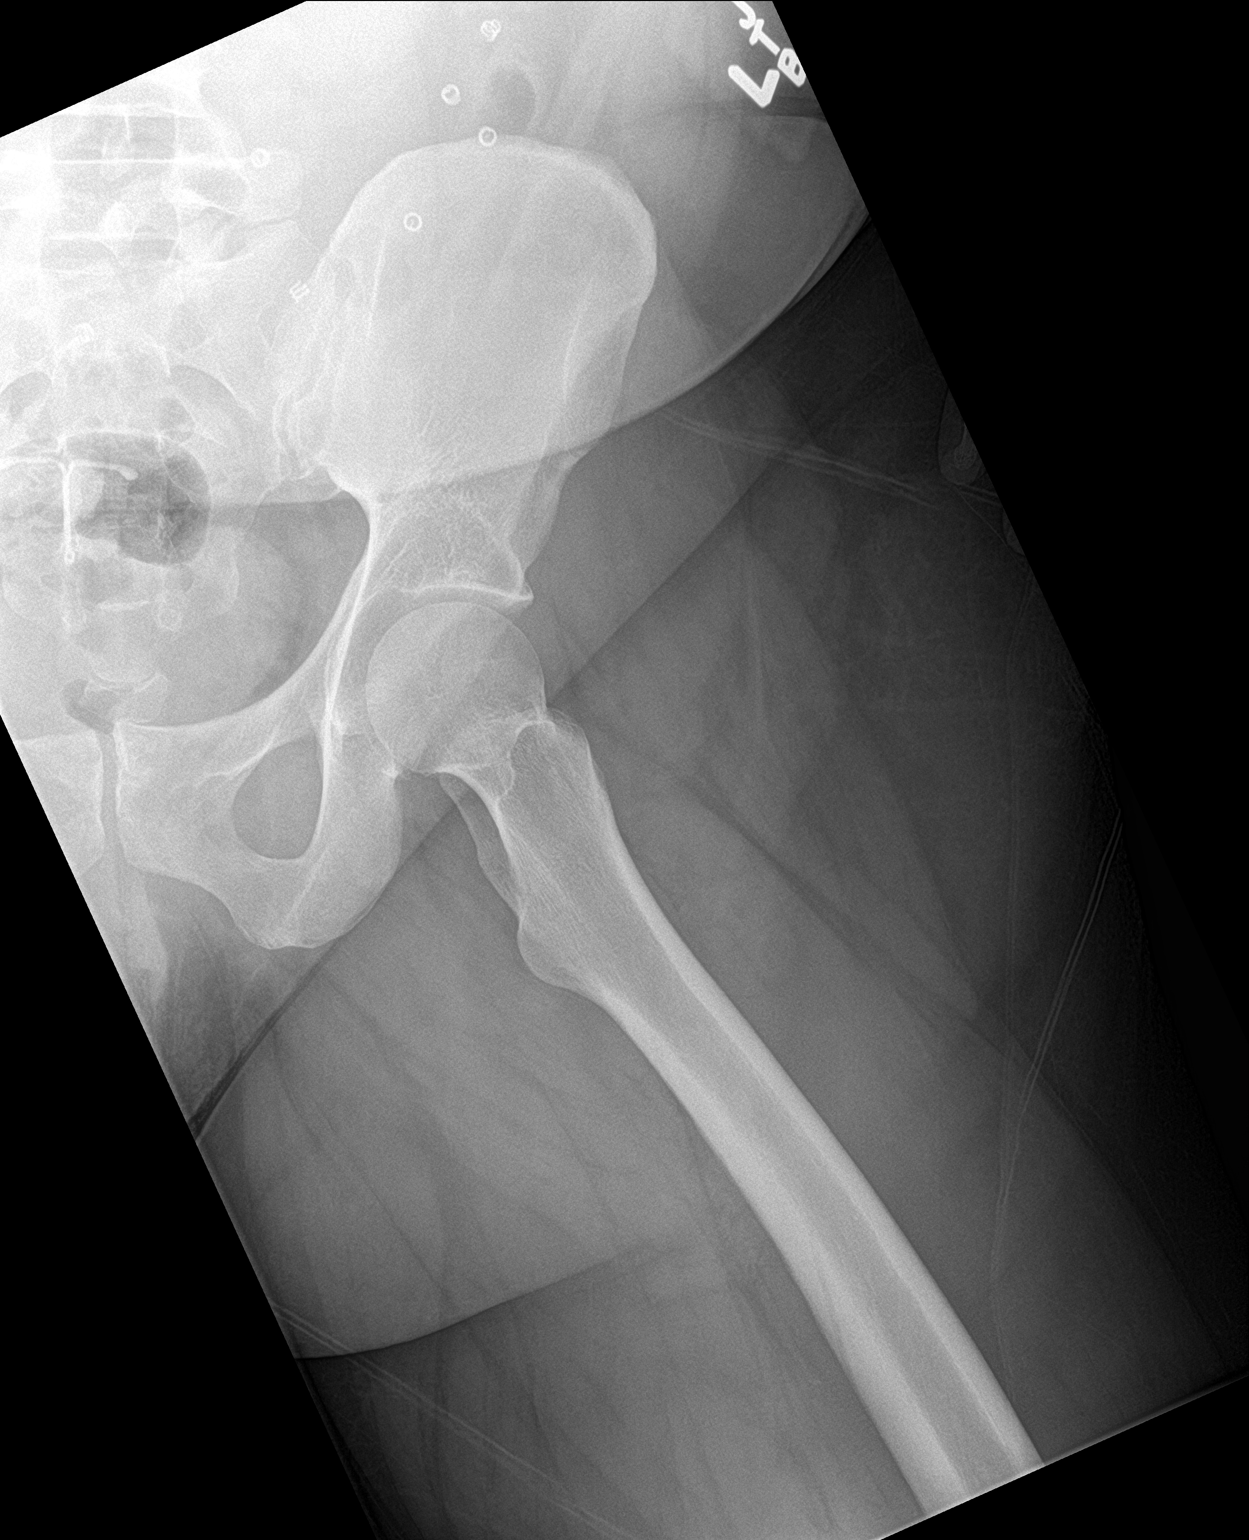

[3 of 3 positions shown; findings below may reference images not displayed]

FINDINGS: Negative for fracture.  Left hip joint normal.

Ventral hernia repair with mesh.  IUD.
IMPRESSION: Negative for fracture

## 2021-05-01 ENCOUNTER — Ambulatory Visit: Payer: 59 | Admitting: Podiatry

## 2021-05-01 ENCOUNTER — Other Ambulatory Visit: Payer: Self-pay

## 2021-05-01 DIAGNOSIS — L600 Ingrowing nail: Secondary | ICD-10-CM

## 2021-05-07 ENCOUNTER — Encounter: Payer: Self-pay | Admitting: Podiatry

## 2021-05-07 NOTE — Progress Notes (Addendum)
Subjective:  Patient ID: NISHIKA PARKHURST, female    DOB: 18-Jun-1980,  MRN: [id number]  Chief Complaint  Patient presents with   Ingrown Toenail    40 y.o. female presents with the above complaint.  Patient presents with left hallux medial border ingrown.  Patient states is painful to touch.  Patient would like to have it have it removed.  She states painful to ambulate on painful issues.  She is tried some self debridement techniques which has not helped.  She denies any other acute complaints.   Review of Systems: Negative except as noted in the HPI. Denies N/V/F/Ch.  Past Medical History:  Diagnosis Date   Depression    Diabetes mellitus without complication (HCC)    Gestational diabetes   Hypertension     Current Outpatient Medications:    acetaminophen (TYLENOL) 500 MG tablet, Take 2 tablets (1,000 mg total) by mouth every 6 (six) hours as needed for mild pain., Disp: 30 tablet, Rfl: 0   amLODipine (NORVASC) 10 MG tablet, Take 10 mg by mouth daily., Disp: , Rfl:    azithromycin (ZITHROMAX) 250 MG tablet, azithromycin 250 mg tablet  TAKE 2 TABLETS BY MOUTH TODAY, THEN TAKE 1 TABLET DAILY FOR 4 DAYS, Disp: , Rfl:    chlorpheniramine-HYDROcodone (TUSSIONEX) 10-8 MG/5ML SUER, hydrocodone 10 mg-chlorpheniramine 8 mg/5 mL oral susp extend.rel 12hr  TAKE 5 ML BY MOUTH EVERY 12 HOURS, Disp: , Rfl:    etonogestrel-ethinyl estradiol (NUVARING) 0.12-0.015 MG/24HR vaginal ring, NuvaRing 0.12 mg-0.015 mg/24 hr vaginal, Disp: , Rfl:    levonorgestrel (MIRENA, 52 MG,) 20 MCG/DAY IUD, Mirena 20 mcg/24 hours (8 yrs) 52 mg intrauterine device  Take by intrauterine route., Disp: , Rfl:    lisinopril-hydrochlorothiazide (ZESTORETIC) 10-12.5 MG tablet, lisinopril 10 mg-hydrochlorothiazide 12.5 mg tablet  TAKE 1 TABLET BY MOUTH EVERY DAY, Disp: , Rfl:    methylPREDNISolone (MEDROL DOSEPAK) 4 MG TBPK tablet, Take by mouth as directed., Disp: , Rfl:    predniSONE (DELTASONE) 10 MG tablet, prednisone 10  mg tablet  TAKE 2 TABLETS BY MOUTH TWICE A DAY FOR 6 DAYS, Disp: , Rfl:    sertraline (ZOLOFT) 100 MG tablet, sertraline 100 mg tablet  TAKE 1 TABLET BY MOUTH EVERY DAY, Disp: , Rfl:    sulfamethoxazole-trimethoprim (BACTRIM DS) 800-160 MG tablet, SMARTSIG:1 Tablet(s) By Mouth Every 12 Hours, Disp: , Rfl:    tinidazole (TINDAMAX) 500 MG tablet, tinidazole 500 mg tablet, Disp: , Rfl:   Social History   Tobacco Use  Smoking Status Never  Smokeless Tobacco Never    Allergies  Allergen Reactions   Amoxicillin Hives    Has patient had a PCN reaction causing immediate rash, facial/tongue/throat swelling, SOB or lightheadedness with hypotension: No Has patient had a PCN reaction causing severe rash involving mucus membranes or skin necrosis: No Has patient had a PCN reaction that required hospitalization: No Has patient had a PCN reaction occurring within the last 10 years: Yes If all of the above answers are "NO", then may proceed with Cephalosporin use.   Keflex [Cephalexin] Hives   Raspberry Swelling   Objective:  There were no vitals filed for this visit. There is no height or weight on file to calculate BMI. Constitutional Well developed. Well nourished.  Vascular Dorsalis pedis pulses palpable bilaterally. Posterior tibial pulses palpable bilaterally. Capillary refill normal to all digits.  No cyanosis or clubbing noted. Pedal hair growth normal.  Neurologic Normal speech. Oriented to person, place, and time. Epicritic sensation to light touch  grossly present bilaterally.  Dermatologic Painful ingrowing nail at medial nail borders of the hallux nail left No other open wounds. No skin lesions.  Orthopedic: Normal joint ROM without pain or crepitus bilaterally. No visible deformities. No bony tenderness.   Radiographs: None Assessment:   1. Ingrown toenail of right foot    Plan:  Patient was evaluated and treated and all questions answered.  Ingrown Nail,  left -Patient elects to proceed with minor surgery to remove ingrown toenail removal today. Consent reviewed and signed by patient. -Ingrown nail excised. See procedure note. -Educated on post-procedure care including soaking. Written instructions provided and reviewed. -Patient to follow up in 2 weeks for nail check.  Procedure: Excision of Ingrown Toenail Location: Left 1st toe medial nail borders. Anesthesia: Lidocaine 1% plain; 1.5 mL and Marcaine 0.5% plain; 1.5 mL, digital block. Skin Prep: Betadine. Dressing: Silvadene; telfa; dry, sterile, compression dressing. Technique: Following skin prep, the toe was exsanguinated and a tourniquet was secured at the base of the toe. The affected nail border was freed, split with a nail splitter, and excised. Chemical matrixectomy was then performed with phenol and irrigated out with alcohol. The tourniquet was then removed and sterile dressing applied. Disposition: Patient tolerated procedure well. Patient to return in 2 weeks for follow-up.   No follow-ups on file.

## 2021-05-15 ENCOUNTER — Telehealth: Payer: Self-pay | Admitting: *Deleted

## 2021-05-15 NOTE — Telephone Encounter (Signed)
Patient is calling because she recently had an ingrown procedure 2 wks ago, and wants to get her nail cut down and polished, is this ok? Please advise.

## 2021-05-19 NOTE — Telephone Encounter (Signed)
Returned the call to patient, giving information per Dr Allena Katz, said that she had gone already and it seems to look ok,advised to call if it starts to look infected,verbalized understanding.

## 2024-01-11 ENCOUNTER — Other Ambulatory Visit

## 2024-01-11 ENCOUNTER — Encounter: Admitting: Oncology

## 2024-01-23 ENCOUNTER — Other Ambulatory Visit

## 2024-01-23 ENCOUNTER — Encounter: Admitting: Internal Medicine

## 2024-01-23 ENCOUNTER — Encounter: Admitting: Nurse Practitioner

## 2024-01-24 ENCOUNTER — Other Ambulatory Visit

## 2024-01-24 ENCOUNTER — Inpatient Hospital Stay

## 2024-01-24 ENCOUNTER — Inpatient Hospital Stay: Attending: Oncology | Admitting: Oncology

## 2024-01-24 ENCOUNTER — Encounter: Payer: Self-pay | Admitting: Oncology

## 2024-01-24 VITALS — BP 113/84 | HR 79 | Temp 96.9°F | Resp 18 | Wt 155.1 lb

## 2024-01-24 DIAGNOSIS — D72820 Lymphocytosis (symptomatic): Secondary | ICD-10-CM

## 2024-01-24 DIAGNOSIS — F1090 Alcohol use, unspecified, uncomplicated: Secondary | ICD-10-CM | POA: Insufficient documentation

## 2024-01-24 DIAGNOSIS — R7989 Other specified abnormal findings of blood chemistry: Secondary | ICD-10-CM

## 2024-01-24 DIAGNOSIS — Z79899 Other long term (current) drug therapy: Secondary | ICD-10-CM | POA: Diagnosis not present

## 2024-01-24 LAB — CBC WITH DIFFERENTIAL/PLATELET
Abs Immature Granulocytes: 0.04 K/uL (ref 0.00–0.07)
Basophils Absolute: 0.1 K/uL (ref 0.0–0.1)
Basophils Relative: 1 %
Eosinophils Absolute: 0.1 K/uL (ref 0.0–0.5)
Eosinophils Relative: 1 %
HCT: 42.6 % (ref 36.0–46.0)
Hemoglobin: 14.5 g/dL (ref 12.0–15.0)
Immature Granulocytes: 1 %
Lymphocytes Relative: 40 %
Lymphs Abs: 3.4 K/uL (ref 0.7–4.0)
MCH: 30.6 pg (ref 26.0–34.0)
MCHC: 34 g/dL (ref 30.0–36.0)
MCV: 89.9 fL (ref 80.0–100.0)
Monocytes Absolute: 0.8 K/uL (ref 0.1–1.0)
Monocytes Relative: 9 %
Neutro Abs: 4 K/uL (ref 1.7–7.7)
Neutrophils Relative %: 48 %
Platelets: 314 K/uL (ref 150–400)
RBC: 4.74 MIL/uL (ref 3.87–5.11)
RDW: 12.6 % (ref 11.5–15.5)
WBC: 8.3 K/uL (ref 4.0–10.5)
nRBC: 0 % (ref 0.0–0.2)

## 2024-01-24 LAB — CMP (CANCER CENTER ONLY)
ALT: 13 U/L (ref 0–44)
AST: 14 U/L — ABNORMAL LOW (ref 15–41)
Albumin: 4.4 g/dL (ref 3.5–5.0)
Alkaline Phosphatase: 54 U/L (ref 38–126)
Anion gap: 8 (ref 5–15)
BUN: 12 mg/dL (ref 6–20)
CO2: 24 mmol/L (ref 22–32)
Calcium: 9.7 mg/dL (ref 8.9–10.3)
Chloride: 103 mmol/L (ref 98–111)
Creatinine: 0.82 mg/dL (ref 0.44–1.00)
GFR, Estimated: >60 mL/min (ref >60)
Glucose, Bld: 86 mg/dL (ref 70–99)
Potassium: 3.7 mmol/L (ref 3.5–5.1)
Sodium: 135 mmol/L (ref 135–145)
Total Bilirubin: 0.8 mg/dL (ref 0.0–1.2)
Total Protein: 7.7 g/dL (ref 6.5–8.1)

## 2024-01-24 LAB — IRON AND TIBC
Iron: 104 ug/dL (ref 28–170)
Saturation Ratios: 28 % (ref 10.4–31.8)
TIBC: 367 ug/dL (ref 250–450)
UIBC: 263 ug/dL

## 2024-01-24 LAB — HEPATITIS PANEL, ACUTE
HCV Ab: NONREACTIVE
Hep A IgM: NONREACTIVE
Hep B C IgM: NONREACTIVE
Hepatitis B Surface Ag: NONREACTIVE

## 2024-01-24 LAB — HIV ANTIBODY (ROUTINE TESTING W REFLEX): HIV Screen 4th Generation wRfx: NONREACTIVE

## 2024-01-24 LAB — FERRITIN: Ferritin: 162 ng/mL (ref 11–307)

## 2024-01-24 NOTE — Assessment & Plan Note (Signed)
 Recommend patient to decrease alcohol consumption.

## 2024-01-24 NOTE — Assessment & Plan Note (Signed)
 Likely reactive.  Check hemochromatosis mutation

## 2024-01-24 NOTE — Progress Notes (Signed)
 Hematology/Oncology Consult note [phone] [phone]        REFERRING PROVIDER: Cristopher Suzen HERO, NP   CHIEF COMPLAINTS/REASON FOR VISIT:  Evaluation of elevated ferritin    ASSESSMENT & PLAN:   Lymphocytosis Check cbc flowcytometry, HIV hepatitis panel.   Elevated ferritin Likely reactive.  Check hemochromatosis mutation  Alcohol use Recommend patient to decrease alcohol consumption.    Orders Placed This Encounter  Procedures   Ferritin    Standing Status:   Future    Number of Occurrences:   1    Expected Date:   01/24/2024    Expiration Date:   04/23/2024   Iron and TIBC    Standing Status:   Future    Number of Occurrences:   1    Expected Date:   01/24/2024    Expiration Date:   04/23/2024   CBC with Differential/Platelet    Standing Status:   Future    Number of Occurrences:   1    Expected Date:   01/24/2024    Expiration Date:   04/23/2024   HIV Antibody (routine testing w rflx)    Standing Status:   Future    Number of Occurrences:   1    Expected Date:   01/24/2024    Expiration Date:   04/23/2024   Hepatitis panel, acute    Standing Status:   Future    Number of Occurrences:   1    Expected Date:   01/24/2024    Expiration Date:   04/23/2024   Flow cytometry panel-leukemia/lymphoma work-up    Standing Status:   Future    Number of Occurrences:   1    Expected Date:   01/24/2024    Expiration Date:   04/23/2024   Hemochromatosis DNA-PCR(c282y,h63d)    Standing Status:   Future    Number of Occurrences:   1    Expected Date:   01/24/2024    Expiration Date:   01/23/2025   CMP ([facility]cer Center only)    Standing Status:   Future    Number of Occurrences:   1    Expected Date:   01/24/2024    Expiration Date:   01/23/2025   Follow up in a few weeks  All questions were answered. The patient knows to call the clinic with any problems, questions or concerns.  Zelphia Cap, MD, PhD [facility] Hematology Oncology 01/24/2024    HISTORY OF PRESENTING ILLNESS:   [name] is a  43 y.o.  female with PMH listed below was seen in consultation at the request of  Cristopher Suzen HERO, NP  for evaluation of elevated ferritin   She has blood work done in April 2025  she was told her ferritin level was 328 ng/mL. A subsequent test on 12/25/2023 showed a ferritin level of 244 ng/mL. Her iron level was 64 g/dL, and iron saturation was 23%. No recent infections, chronic inflammation, or autoimmune diseases. She does not take any over-the-counter supplements, including iron or vitamin C, except for occasional vitamin C cough drops during winter.  12/15/2023 lymphocyte 3.9 12/24/2023 lymphocyte 4.2   She has been using a Mirena IUD for ten years and has no other prosthetic devices except for a recent dental crown replacement. No recent dental issues at the time of her blood work. Her appetite is normal, though she eats once a day and drinks coffee frequently. She reports regular bowel movements and takes a probiotic to maintain gut health.  She maintains an active lifestyle, playing basketball with her son and starting volleyball with her daughter. She drinks alcohol, typically a couple of ounces of whiskey or margarita mix, about five days a week. She experienced a period of heavy drinking following her grandmother's death last year but has since reduced her alcohol intake.  Her past medical history includes a hernia repair in 2010, appendectomy in 2019, and diverticulosis noted on a previous CT scan. She has a history of sinus infections, particularly in the fall, and has had elevated white blood cell counts in past lab work, often associated with infections. No family history of hemochromatosis.    MEDICAL HISTORY:  Past Medical History:  Diagnosis Date   Depression    Diabetes mellitus without complication (HCC)    Gestational diabetes   Hypertension     SURGICAL HISTORY: Past Surgical History:  Procedure  Laterality Date   bowel obstruction     BREAST SURGERY     CESAREAN SECTION     CESAREAN SECTION N/A 11/03/2013   Procedure: CESAREAN SECTION;  Surgeon: Dickie DELENA Carder, MD;  Location: [facility];  Service: Obstetrics;  Laterality: N/A;   HERNIA REPAIR     LAPAROSCOPIC APPENDECTOMY N/A 09/20/2017   Procedure: APPENDECTOMY LAPAROSCOPIC;  Surgeon: Tanda Locus, MD;  Location: [facility] OR;  Service: General;  Laterality: N/A;    SOCIAL HISTORY: Social History   Socioeconomic History   Marital status: Married    Spouse name: Not on file   Number of children: Not on file   Years of education: Not on file   Highest education level: Not on file  Occupational History   Not on file  Tobacco Use   Smoking status: Never   Smokeless tobacco: Never  Substance and Sexual Activity   Alcohol use: No   Drug use: No   Sexual activity: Yes    Birth control/protection: None  Other Topics Concern   Not on file  Social History Narrative   Not on file   Social Drivers of Health   Financial Resource Strain: Low Risk  (01/24/2024)   Overall Financial Resource Strain (CARDIA)    Difficulty of Paying Living Expenses: Not very hard  Food Insecurity: No Food Insecurity (01/24/2024)   Hunger Vital Sign    Worried About Running Out of Food in the Last Year: Never true    Ran Out of Food in the Last Year: Never true  Transportation Needs: No Transportation Needs (01/24/2024)   PRAPARE - Administrator, Civil Service (Medical): No    Lack of Transportation (Non-Medical): No  Physical Activity: Not on file  Stress: No Stress Concern Present (01/24/2024)   [employer] of Occupational Health - Occupational Stress Questionnaire    Feeling of Stress: Only a little  Social Connections: Not on file  Intimate Partner Violence: Not At Risk (01/24/2024)   Humiliation, Afraid, Rape, and Kick questionnaire    Fear of Current or Ex-Partner: No    Emotionally Abused: No    Physically Abused: No     Sexually Abused: No    FAMILY HISTORY: Family History  Problem Relation Age of Onset   Asthma Mother    Diabetes Mother    Hypertension Mother    Diabetes Father    Hypertension Father    Asthma Son    Arthritis Maternal Grandmother    COPD Maternal Grandmother    Heart disease Maternal Grandmother     ALLERGIES:  is allergic to amoxicillin, keflex [cephalexin],  and raspberry.  MEDICATIONS:  Current Outpatient Medications  Medication Sig Dispense Refill   ALPRAZolam (XANAX) 0.5 MG tablet TAKE 1 TABLET (ORAL) 2 TIMES PER DAY (PRN - ANXIETY) FOR 30 DAYS     amLODipine (NORVASC) 10 MG tablet Take 10 mg by mouth daily.     levonorgestrel (MIRENA, 52 MG,) 20 MCG/DAY IUD Mirena 20 mcg/24 hours (8 yrs) 52 mg intrauterine device  Take by intrauterine route.     lisinopril-hydrochlorothiazide (ZESTORETIC) 10-12.5 MG tablet lisinopril 10 mg-hydrochlorothiazide 12.5 mg tablet  TAKE 1 TABLET BY MOUTH EVERY DAY     sertraline (ZOLOFT) 100 MG tablet sertraline 100 mg tablet  TAKE 1 TABLET BY MOUTH EVERY DAY     No current facility-administered medications for this visit.    Review of Systems  Constitutional:  Negative for appetite change, chills, fatigue and fever.  HENT:   Negative for hearing loss and voice change.   Eyes:  Negative for eye problems.  Respiratory:  Negative for chest tightness and cough.   Cardiovascular:  Negative for chest pain.  Gastrointestinal:  Negative for abdominal distention, abdominal pain and blood in stool.  Endocrine: Negative for hot flashes.  Genitourinary:  Negative for difficulty urinating and frequency.   Musculoskeletal:  Negative for arthralgias.  Skin:  Negative for itching and rash.  Neurological:  Negative for extremity weakness.  Hematological:  Negative for adenopathy.  Psychiatric/Behavioral:  Negative for confusion.    PHYSICAL EXAMINATION:  Vitals:   01/24/24 0936  BP: 113/84  Pulse: 79  Resp: 18  Temp: (!) 96.9 F (36.1 C)   SpO2: 100%   Filed Weights   01/24/24 0936  Weight: 155 lb 1.6 oz (70.4 kg)    Physical Exam Constitutional:      General: She is not in acute distress. HENT:     Head: Normocephalic and atraumatic.  Eyes:     General: No scleral icterus. Cardiovascular:     Rate and Rhythm: Normal rate and regular rhythm.     Heart sounds: Normal heart sounds.  Pulmonary:     Effort: Pulmonary effort is normal. No respiratory distress.     Breath sounds: No wheezing.  Abdominal:     General: Bowel sounds are normal. There is no distension.     Palpations: Abdomen is soft.  Musculoskeletal:        General: No deformity. Normal range of motion.     Cervical back: Normal range of motion and neck supple.  Skin:    General: Skin is warm and dry.     Findings: No erythema or rash.  Neurological:     Mental Status: She is alert and oriented to person, place, and time. Mental status is at baseline.     Cranial Nerves: No cranial nerve deficit.     Coordination: Coordination normal.  Psychiatric:        Mood and Affect: Mood normal.     LABORATORY DATA:  I have reviewed the data as listed    Latest Ref Rng & Units 01/24/2024   10:13 AM 09/21/2017    6:21 AM 09/20/2017    2:13 PM  CBC  WBC 4.0 - 10.5 K/uL 8.3  18.4  24.0   Hemoglobin 12.0 - 15.0 g/dL 85.4  86.3  84.7   Hematocrit 36.0 - 46.0 % 42.6  39.8  44.1   Platelets 150 - 400 K/uL 314  311  310       Latest Ref Rng & Units 01/24/2024  10:13 AM 09/21/2017    6:21 AM 09/20/2017    2:05 PM  CMP  Glucose 70 - 99 mg/dL 86  862  882   BUN 6 - 20 mg/dL 12  8  10    Creatinine 0.44 - 1.00 mg/dL 9.17  9.08  9.10   Sodium 135 - 145 mmol/L 135  136  137   Potassium 3.5 - 5.1 mmol/L 3.7  3.9  3.8   Chloride 98 - 111 mmol/L 103  104  104   CO2 22 - 32 mmol/L 24  22  23    Calcium  8.9 - 10.3 mg/dL 9.7  9.0  9.6   Total Protein 6.5 - 8.1 g/dL 7.7   7.7   Total Bilirubin 0.0 - 1.2 mg/dL 0.8   0.9   Alkaline Phos 38 - 126 U/L 54   73   AST  15 - 41 U/L 14   38   ALT 0 - 44 U/L 13   26       RADIOGRAPHIC STUDIES: I have personally reviewed the radiological images as listed and agreed with the findings in the report. No results found.

## 2024-01-24 NOTE — Assessment & Plan Note (Signed)
 Check cbc flowcytometry, HIV hepatitis panel.

## 2024-01-31 LAB — COMP PANEL: LEUKEMIA/LYMPHOMA

## 2024-01-31 LAB — HEMOCHROMATOSIS DNA-PCR(C282Y,H63D)

## 2024-02-20 ENCOUNTER — Ambulatory Visit: Admitting: Oncology

## 2024-02-23 ENCOUNTER — Inpatient Hospital Stay: Attending: Oncology | Admitting: Oncology

## 2024-02-23 ENCOUNTER — Encounter: Payer: Self-pay | Admitting: Oncology

## 2024-02-23 VITALS — BP 104/76 | HR 83 | Temp 98.6°F | Resp 20 | Wt 158.6 lb

## 2024-02-23 DIAGNOSIS — D72820 Lymphocytosis (symptomatic): Secondary | ICD-10-CM | POA: Diagnosis not present

## 2024-02-23 DIAGNOSIS — R7989 Other specified abnormal findings of blood chemistry: Secondary | ICD-10-CM | POA: Insufficient documentation

## 2024-02-23 NOTE — Assessment & Plan Note (Signed)
 Likely reactive.  Lab Results  Component Value Date   HGB 14.5 01/24/2024   TIBC 367 01/24/2024   IRONPCTSAT 28 01/24/2024   FERRITIN 162 01/24/2024    Iron panel is within normal limits. Negative hemochromatosis mutation Will hold off further workup.

## 2024-02-23 NOTE — Assessment & Plan Note (Signed)
 Labs reviewed and discussed with patient. CBC showed a normal white count with normal differential.  Negative peripheral blood flow cytometry.  Negative HIV and hepatitis.

## 2024-02-23 NOTE — Progress Notes (Signed)
 Hematology/Oncology Consult note [phone] [phone]        REFERRING PROVIDER: Cristopher Suzen HERO, NP   CHIEF COMPLAINTS/REASON FOR VISIT:  Evaluation of elevated ferritin    ASSESSMENT & PLAN:   Lymphocytosis Labs reviewed and discussed with patient. CBC showed a normal white count with normal differential.  Negative peripheral blood flow cytometry.  Negative HIV and hepatitis.  Elevated ferritin Likely reactive.  Lab Results  Component Value Date   HGB 14.5 01/24/2024   TIBC 367 01/24/2024   IRONPCTSAT 28 01/24/2024   FERRITIN 162 01/24/2024    Iron panel is within normal limits. Negative hemochromatosis mutation Will hold off further workup.  Patient is discharged from my clinic. I recommend patient to continue follow up with primary care physician. Patient may re-establish care in the future if clinically indicated.  All questions were answered. The patient knows to call the clinic with any problems, questions or concerns.  Zelphia Cap, MD, PhD [facility] Hematology Oncology 02/23/2024   HISTORY OF PRESENTING ILLNESS:   [name] is a  43 y.o.  female with PMH listed below was seen in consultation at the request of  Cristopher Suzen HERO, NP  for evaluation of elevated ferritin   She has blood work done in April 2025  she was told her ferritin level was 328 ng/mL. A subsequent test on 12/25/2023 showed a ferritin level of 244 ng/mL. Her iron level was 64 g/dL, and iron saturation was 23%. No recent infections, chronic inflammation, or autoimmune diseases. She does not take any over-the-counter supplements, including iron or vitamin C, except for occasional vitamin C cough drops during winter.  12/15/2023 lymphocyte 3.9 12/24/2023 lymphocyte 4.2   She has been using a Mirena IUD for ten years and has no other prosthetic devices except for a recent dental crown replacement. No recent dental issues at the time of her blood work. Her  appetite is normal, though she eats once a day and drinks coffee frequently. She reports regular bowel movements and takes a probiotic to maintain gut health.  She maintains an active lifestyle, playing basketball with her son and starting volleyball with her daughter. She drinks alcohol, typically a couple of ounces of whiskey or margarita mix, about five days a week. She experienced a period of heavy drinking following her grandmother's death last year but has since reduced her alcohol intake.  Her past medical history includes a hernia repair in 2010, appendectomy in 2019, and diverticulosis noted on a previous CT scan. She has a history of sinus infections, particularly in the fall, and has had elevated white blood cell counts in past lab work, often associated with infections. No family history of hemochromatosis.  INTERVAL HISTORY [name] is a 43 y.o. female who has above history reviewed by me today presents for follow up visit to go over results.  She has no new complaints.  MEDICAL HISTORY:  Past Medical History:  Diagnosis Date   Depression    Diabetes mellitus without complication (HCC)    Gestational diabetes   Hypertension     SURGICAL HISTORY: Past Surgical History:  Procedure Laterality Date   bowel obstruction     BREAST SURGERY     CESAREAN SECTION     CESAREAN SECTION N/A 11/03/2013   Procedure: CESAREAN SECTION;  Surgeon: Dickie DELENA Carder, MD;  Location: [facility];  Service: Obstetrics;  Laterality: N/A;   HERNIA REPAIR     LAPAROSCOPIC APPENDECTOMY N/A 09/20/2017   Procedure:  APPENDECTOMY LAPAROSCOPIC;  Surgeon: Tanda Locus, MD;  Location: [facility] OR;  Service: General;  Laterality: N/A;    SOCIAL HISTORY: Social History   Socioeconomic History   Marital status: Married    Spouse name: Not on file   Number of children: Not on file   Years of education: Not on file   Highest education level: Not on file  Occupational History   Not on file  Tobacco Use    Smoking status: Never   Smokeless tobacco: Never  Substance and Sexual Activity   Alcohol use: No   Drug use: No   Sexual activity: Yes    Birth control/protection: None  Other Topics Concern   Not on file  Social History Narrative   Not on file   Social Drivers of Health   Financial Resource Strain: Low Risk  (01/24/2024)   Overall Financial Resource Strain (CARDIA)    Difficulty of Paying Living Expenses: Not very hard  Food Insecurity: No Food Insecurity (01/24/2024)   Hunger Vital Sign    Worried About Running Out of Food in the Last Year: Never true    Ran Out of Food in the Last Year: Never true  Transportation Needs: No Transportation Needs (01/24/2024)   PRAPARE - Administrator, Civil Service (Medical): No    Lack of Transportation (Non-Medical): No  Physical Activity: Not on file  Stress: No Stress Concern Present (01/24/2024)   [employer] of Occupational Health - Occupational Stress Questionnaire    Feeling of Stress: Only a little  Social Connections: Not on file  Intimate Partner Violence: Not At Risk (01/24/2024)   Humiliation, Afraid, Rape, and Kick questionnaire    Fear of Current or Ex-Partner: No    Emotionally Abused: No    Physically Abused: No    Sexually Abused: No    FAMILY HISTORY: Family History  Problem Relation Age of Onset   Asthma Mother    Diabetes Mother    Hypertension Mother    Diabetes Father    Hypertension Father    Asthma Son    Arthritis Maternal Grandmother    COPD Maternal Grandmother    Heart disease Maternal Grandmother     ALLERGIES:  is allergic to amoxicillin, keflex [cephalexin], and raspberry.  MEDICATIONS:  Current Outpatient Medications  Medication Sig Dispense Refill   ALPRAZolam (XANAX) 0.5 MG tablet TAKE 1 TABLET (ORAL) 2 TIMES PER DAY (PRN - ANXIETY) FOR 30 DAYS     amLODipine (NORVASC) 10 MG tablet Take 10 mg by mouth daily.     levonorgestrel (MIRENA, 52 MG,) 20 MCG/DAY IUD Mirena 20  mcg/24 hours (8 yrs) 52 mg intrauterine device  Take by intrauterine route.     lisinopril-hydrochlorothiazide (ZESTORETIC) 10-12.5 MG tablet lisinopril 10 mg-hydrochlorothiazide 12.5 mg tablet  TAKE 1 TABLET BY MOUTH EVERY DAY     sertraline (ZOLOFT) 100 MG tablet sertraline 100 mg tablet  TAKE 1 TABLET BY MOUTH EVERY DAY     No current facility-administered medications for this visit.    Review of Systems  Constitutional:  Negative for appetite change, chills, fatigue and fever.  HENT:   Negative for hearing loss and voice change.   Eyes:  Negative for eye problems.  Respiratory:  Negative for chest tightness and cough.   Cardiovascular:  Negative for chest pain.  Gastrointestinal:  Negative for abdominal distention, abdominal pain and blood in stool.  Endocrine: Negative for hot flashes.  Genitourinary:  Negative for difficulty urinating and frequency.  Musculoskeletal:  Negative for arthralgias.  Skin:  Negative for itching and rash.  Neurological:  Negative for extremity weakness.  Hematological:  Negative for adenopathy.  Psychiatric/Behavioral:  Negative for confusion.    PHYSICAL EXAMINATION:  Vitals:   02/23/24 1453  BP: 104/76  Pulse: 83  Resp: 20  Temp: 98.6 F (37 C)  SpO2: 100%   Filed Weights   02/23/24 1453  Weight: 158 lb 9.6 oz (71.9 kg)    Physical Exam Constitutional:      General: She is not in acute distress. HENT:     Head: Normocephalic and atraumatic.  Eyes:     General: No scleral icterus. Cardiovascular:     Rate and Rhythm: Normal rate and regular rhythm.     Heart sounds: Normal heart sounds.  Pulmonary:     Effort: Pulmonary effort is normal. No respiratory distress.     Breath sounds: No wheezing.  Abdominal:     General: Bowel sounds are normal. There is no distension.     Palpations: Abdomen is soft.  Musculoskeletal:        General: No deformity. Normal range of motion.     Cervical back: Normal range of motion and neck  supple.  Skin:    General: Skin is warm and dry.     Findings: No erythema or rash.  Neurological:     Mental Status: She is alert and oriented to person, place, and time. Mental status is at baseline.     Cranial Nerves: No cranial nerve deficit.     Coordination: Coordination normal.  Psychiatric:        Mood and Affect: Mood normal.     LABORATORY DATA:  I have reviewed the data as listed    Latest Ref Rng & Units 01/24/2024   10:13 AM 09/21/2017    6:21 AM 09/20/2017    2:13 PM  CBC  WBC 4.0 - 10.5 K/uL 8.3  18.4  24.0   Hemoglobin 12.0 - 15.0 g/dL 85.4  86.3  84.7   Hematocrit 36.0 - 46.0 % 42.6  39.8  44.1   Platelets 150 - 400 K/uL 314  311  310       Latest Ref Rng & Units 01/24/2024   10:13 AM 09/21/2017    6:21 AM 09/20/2017    2:05 PM  CMP  Glucose 70 - 99 mg/dL 86  862  882   BUN 6 - 20 mg/dL 12  8  10    Creatinine 0.44 - 1.00 mg/dL 9.17  9.08  9.10   Sodium 135 - 145 mmol/L 135  136  137   Potassium 3.5 - 5.1 mmol/L 3.7  3.9  3.8   Chloride 98 - 111 mmol/L 103  104  104   CO2 22 - 32 mmol/L 24  22  23    Calcium  8.9 - 10.3 mg/dL 9.7  9.0  9.6   Total Protein 6.5 - 8.1 g/dL 7.7   7.7   Total Bilirubin 0.0 - 1.2 mg/dL 0.8   0.9   Alkaline Phos 38 - 126 U/L 54   73   AST 15 - 41 U/L 14   38   ALT 0 - 44 U/L 13   26       RADIOGRAPHIC STUDIES: I have personally reviewed the radiological images as listed and agreed with the findings in the report. No results found.
